# Patient Record
Sex: Male | Born: 1970
Health system: Southern US, Community
[De-identification: ages and names within clinical notes are randomized; demographics above are authoritative.]

## PROBLEM LIST (undated history)

## (undated) DIAGNOSIS — F1010 Alcohol abuse, uncomplicated: Secondary | ICD-10-CM

## (undated) DIAGNOSIS — I10 Essential (primary) hypertension: Secondary | ICD-10-CM

## (undated) HISTORY — DX: Essential (primary) hypertension: I10

## (undated) HISTORY — DX: Alcohol abuse, uncomplicated: F10.10

## (undated) HISTORY — PX: HERNIA REPAIR: SHX51

## (undated) HISTORY — PX: LUNG SURGERY: SHX703

## (undated) HISTORY — PX: OTHER SURGICAL HISTORY: SHX169

---

## 2007-12-09 ENCOUNTER — Emergency Department (HOSPITAL_COMMUNITY): Admission: EM | Admit: 2007-12-09 | Discharge: 2007-12-09 | Payer: Self-pay | Admitting: Emergency Medicine

## 2008-01-15 ENCOUNTER — Emergency Department (HOSPITAL_COMMUNITY): Admission: EM | Admit: 2008-01-15 | Discharge: 2008-01-15 | Payer: Self-pay | Admitting: Emergency Medicine

## 2008-10-08 ENCOUNTER — Emergency Department (HOSPITAL_COMMUNITY): Admission: EM | Admit: 2008-10-08 | Discharge: 2008-10-08 | Payer: Self-pay | Admitting: Emergency Medicine

## 2009-06-20 ENCOUNTER — Emergency Department (HOSPITAL_COMMUNITY): Admission: EM | Admit: 2009-06-20 | Discharge: 2009-06-20 | Payer: Self-pay | Admitting: Emergency Medicine

## 2009-11-01 ENCOUNTER — Emergency Department (HOSPITAL_COMMUNITY): Admission: EM | Admit: 2009-11-01 | Discharge: 2009-11-01 | Payer: Self-pay | Admitting: Emergency Medicine

## 2010-08-15 LAB — GC/CHLAMYDIA PROBE AMP, GENITAL
Chlamydia, DNA Probe: NEGATIVE
GC Probe Amp, Genital: NEGATIVE

## 2010-08-15 LAB — HEMOCCULT GUIAC POC 1CARD (OFFICE): Fecal Occult Bld: POSITIVE

## 2010-08-15 LAB — POCT I-STAT, CHEM 8
Chloride: 108 mEq/L (ref 96–112)
HCT: 50 % (ref 39.0–52.0)
Hemoglobin: 17 g/dL (ref 13.0–17.0)
Potassium: 4.5 mEq/L (ref 3.5–5.1)

## 2010-08-16 LAB — URINALYSIS, ROUTINE W REFLEX MICROSCOPIC
Bilirubin Urine: NEGATIVE
Ketones, ur: NEGATIVE mg/dL
Nitrite: NEGATIVE
Specific Gravity, Urine: 1.026 (ref 1.005–1.030)
Urobilinogen, UA: 1 mg/dL (ref 0.0–1.0)
pH: 5.5 (ref 5.0–8.0)

## 2010-08-16 LAB — URINE CULTURE: Colony Count: 2000

## 2010-08-16 LAB — URINE MICROSCOPIC-ADD ON

## 2010-08-16 LAB — GC/CHLAMYDIA PROBE AMP, GENITAL
Chlamydia, DNA Probe: NEGATIVE
GC Probe Amp, Genital: NEGATIVE

## 2010-10-11 ENCOUNTER — Emergency Department (HOSPITAL_COMMUNITY)
Admission: EM | Admit: 2010-10-11 | Discharge: 2010-10-11 | Disposition: A | Discharge status: Home / Self Care | Payer: Self-pay | Attending: Emergency Medicine | Admitting: Emergency Medicine

## 2010-10-11 DIAGNOSIS — M5380 Other specified dorsopathies, site unspecified: Secondary | ICD-10-CM | POA: Insufficient documentation

## 2010-10-11 DIAGNOSIS — M545 Low back pain, unspecified: Secondary | ICD-10-CM | POA: Insufficient documentation

## 2010-10-11 DIAGNOSIS — M542 Cervicalgia: Secondary | ICD-10-CM | POA: Insufficient documentation

## 2010-10-11 DIAGNOSIS — M546 Pain in thoracic spine: Secondary | ICD-10-CM | POA: Insufficient documentation

## 2011-02-24 LAB — URINALYSIS, ROUTINE W REFLEX MICROSCOPIC
Glucose, UA: NEGATIVE
Hgb urine dipstick: NEGATIVE
pH: 6

## 2011-02-24 LAB — GC/CHLAMYDIA PROBE AMP, GENITAL: GC Probe Amp, Genital: NEGATIVE

## 2012-02-20 ENCOUNTER — Ambulatory Visit (INDEPENDENT_AMBULATORY_CARE_PROVIDER_SITE_OTHER): Payer: PRIVATE HEALTH INSURANCE | Admitting: Family Medicine

## 2012-02-20 ENCOUNTER — Ambulatory Visit: Payer: PRIVATE HEALTH INSURANCE

## 2012-02-20 VITALS — BP 128/92 | HR 66 | Temp 97.8°F | Resp 16 | Ht 72.5 in | Wt 216.0 lb

## 2012-02-20 DIAGNOSIS — M545 Low back pain: Secondary | ICD-10-CM

## 2012-02-20 MED ORDER — CYCLOBENZAPRINE HCL 10 MG PO TABS: 10.0000 mg | ORAL_TABLET | Freq: Two times a day (BID) | ORAL | Status: DC | PRN
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
## 2012-02-21 ENCOUNTER — Telehealth: Payer: Self-pay

## 2012-02-21 NOTE — Telephone Encounter (Signed)
Pt in pain and would like to speak to clinical, saw Dr. Patsy Lager yesterday.    [phone]

## 2012-02-22 NOTE — Telephone Encounter (Signed)
Pt came into the office today to get a note to be out until Monday. Dr Patsy Lager approved the note and I advised pt to come in if he is not better by Monday. Pt understood

## 2012-03-05 ENCOUNTER — Telehealth: Payer: Self-pay

## 2012-03-05 DIAGNOSIS — M549 Dorsalgia, unspecified: Secondary | ICD-10-CM

## 2012-03-05 NOTE — Telephone Encounter (Signed)
PT STATES HE IS STILL HAVING BACK PAIN AND WOULD LIKE TO BE REFERRED PLEASE CALL [phone]

## 2012-03-05 NOTE — Telephone Encounter (Signed)
Called Mr. Liad- we only have one number listed.  I was not able to leave a VM, no answer.  I put in a referral to ortho for him.  Will try him again tomorrow

## 2012-03-05 NOTE — Telephone Encounter (Signed)
I spoke to patient to advise, he has been seen at [facility], for another problem. Wants referral to go there. He is now having right leg pain and back pain has not improved. He was thankful. I left message for referrals so they would know where to send this referral. Thanks Amy

## 2012-03-05 NOTE — Telephone Encounter (Signed)
Dr. Copland, please advise 

## 2013-08-13 ENCOUNTER — Encounter: Payer: Self-pay | Admitting: Physician Assistant

## 2013-08-13 ENCOUNTER — Ambulatory Visit (INDEPENDENT_AMBULATORY_CARE_PROVIDER_SITE_OTHER): Payer: PRIVATE HEALTH INSURANCE | Admitting: Physician Assistant

## 2013-08-13 VITALS — BP 132/80 | HR 78 | Temp 98.1°F | Ht 74.0 in | Wt 224.0 lb

## 2013-08-13 DIAGNOSIS — K5900 Constipation, unspecified: Secondary | ICD-10-CM | POA: Insufficient documentation

## 2013-08-13 DIAGNOSIS — Z0000 Encounter for general adult medical examination without abnormal findings: Secondary | ICD-10-CM

## 2013-08-13 DIAGNOSIS — Z23 Encounter for immunization: Secondary | ICD-10-CM

## 2013-08-13 DIAGNOSIS — K219 Gastro-esophageal reflux disease without esophagitis: Secondary | ICD-10-CM | POA: Insufficient documentation

## 2013-08-13 DIAGNOSIS — F17200 Nicotine dependence, unspecified, uncomplicated: Secondary | ICD-10-CM

## 2013-08-13 MED ORDER — OMEPRAZOLE 20 MG PO CPDR: 20.0000 mg | DELAYED_RELEASE_CAPSULE | Freq: Every day | ORAL | Status: DC
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
## 2013-08-13 NOTE — Assessment & Plan Note (Signed)
Rx for Omeprazole 20 mg once daily Counseled patient to take medication daily and not wait till symptoms present as he has in past.  Counseled patient on prevention of symptoms to include discontinue smoking, cut back on greasy/spicy foods, elevate bed, smaller meals, no eating within two hours of going to bed.

## 2013-08-13 NOTE — Patient Instructions (Addendum)
It was great meeting you today Scott Galloway!  Labs have been ordered for you, when you report to lab please be fasting.     High-Fiber Diet Fiber is found in fruits, vegetables, and grains. A high-fiber diet encourages the addition of more whole grains, legumes, fruits, and vegetables in your diet. The recommended amount of fiber for adult males is 38 g per day. For adult females, it is 25 g per day. Pregnant and lactating women should get 28 g of fiber per day. If you have a digestive or bowel problem, ask your caregiver for advice before adding high-fiber foods to your diet. Eat a variety of high-fiber foods instead of only a select few type of foods.  PURPOSE  To increase stool bulk.  To make bowel movements more regular to prevent constipation.  To lower cholesterol.  To prevent overeating. WHEN IS THIS DIET USED?  It may be used if you have constipation and hemorrhoids.  It may be used if you have uncomplicated diverticulosis (intestine condition) and irritable bowel syndrome.  It may be used if you need help with weight management.  It may be used if you want to add it to your diet as a protective measure against atherosclerosis, diabetes, and cancer. SOURCES OF FIBER  Whole-grain breads and cereals.  Fruits, such as apples, oranges, bananas, berries, prunes, and pears.  Vegetables, such as green peas, carrots, sweet potatoes, beets, broccoli, cabbage, spinach, and artichokes.  Legumes, such split peas, soy, lentils.  Almonds. FIBER CONTENT IN FOODS Starches and Grains / Dietary Fiber (g)  Cheerios, 1 cup / 3 g  Corn Flakes cereal, 1 cup / 0.7 g  Rice crispy treat cereal, 1 cup / 0.3 g  Instant oatmeal (cooked),  cup / 2 g  Frosted wheat cereal, 1 cup / 5.1 g  Brown, long-grain rice (cooked), 1 cup / 3.5 g  White, long-grain rice (cooked), 1 cup / 0.6 g  Enriched macaroni (cooked), 1 cup / 2.5 g Legumes / Dietary Fiber (g)  Baked beans (canned, plain, or  vegetarian),  cup / 5.2 g  Kidney beans (canned),  cup / 6.8 g  Pinto beans (cooked),  cup / 5.5 g Breads and Crackers / Dietary Fiber (g)  Plain or honey graham crackers, 2 squares / 0.7 g  Saltine crackers, 3 squares / 0.3 g  Plain, salted pretzels, 10 pieces / 1.8 g  Whole-wheat bread, 1 slice / 1.9 g  White bread, 1 slice / 0.7 g  Raisin bread, 1 slice / 1.2 g  Plain bagel, 3 oz / 2 g  Flour tortilla, 1 oz / 0.9 g  Corn tortilla, 1 small / 1.5 g  Hamburger or hotdog bun, 1 small / 0.9 g Fruits / Dietary Fiber (g)  Apple with skin, 1 medium / 4.4 g  Sweetened applesauce,  cup / 1.5 g  Banana,  medium / 1.5 g  Grapes, 10 grapes / 0.4 g  Orange, 1 small / 2.3 g  Raisin, 1.5 oz / 1.6 g  Melon, 1 cup / 1.4 g Vegetables / Dietary Fiber (g)  Green beans (canned),  cup / 1.3 g  Carrots (cooked),  cup / 2.3 g  Broccoli (cooked),  cup / 2.8 g  Peas (cooked),  cup / 4.4 g  Mashed potatoes,  cup / 1.6 g  Lettuce, 1 cup / 0.5 g  Corn (canned),  cup / 1.6 g  Tomato,  cup / 1.1 g Document Released: 05/16/2005 Document  Revised: 11/15/2011 Document Reviewed: 08/18/2011 [facility] Patient Information 2014 [place], [place].   Constipation, Adult Constipation is when a person:  Poops (bowel movement) less than 3 times a week.  Has a hard time pooping.  Has poop that is dry, hard, or bigger than normal. HOME CARE   Eat more fiber, such as fruits, vegetables, whole grains like brown rice, and beans.  Eat less fatty foods and sugar. This includes [place] fries, hamburgers, cookies, candy, and soda.  If you are not getting enough fiber from food, take products with added fiber in them (supplements).  Drink enough fluid to keep your pee (urine) clear or pale yellow.  Go to the restroom when you feel like you need to poop. Do not hold it.  Only take medicine as told by your doctor. Do not take medicines that help you poop (laxatives) without talking  to your doctor first.  Exercise on a regular basis, or as told by your doctor. GET HELP RIGHT AWAY IF:   You have bright red blood in your poop (stool).  Your constipation lasts more than 4 days or gets worse.  You have belly (abdomen) or butt (rectal) pain.  You have thin poop (as thin as a pencil).  You lose weight, and it cannot be explained. MAKE SURE YOU:   Understand these instructions.  Will watch your condition.  Will get help right away if you are not doing well or get worse. Document Released: 11/02/2007 Document Revised: 08/08/2011 Document Reviewed: 02/25/2013 [facility] Patient Information 2014 [place], [place]. Diet for Gastroesophageal Reflux Disease, Adult Reflux is when stomach acid flows up into the esophagus. The esophagus becomes irritated and sore (inflammation). When reflux happens often and is severe, it is called gastroesophageal reflux disease (GERD). What you eat can help ease any discomfort caused by GERD. FOODS OR DRINKS TO AVOID OR LIMIT  Coffee and black tea, with or without caffeine.  Bubbly (carbonated) drinks with caffeine or energy drinks.  Strong spices, such as pepper, cayenne pepper, curry, or chili powder.  Peppermint or spearmint.  Chocolate.  High-fat foods, such as meats, fried food, oils, butter, or nuts.  Fruits and vegetables that cause discomfort. This includes citrus fruits and tomatoes.  Alcohol. If a certain food or drink irritates your GERD, avoid eating or drinking it. THINGS THAT MAY HELP GERD INCLUDE:  Eat meals slowly.  Eat 5 to 6 small meals a day, not 3 large meals.  Do not eat food for a certain amount of time if it causes discomfort.  Wait 3 hours after eating before lying down.  Keep the head of your bed raised 6 to 9 inches (15 23 centimeters). Put a foam wedge or blocks under the legs of the bed.  Stay active. Weight loss, if needed, may help ease your discomfort.  Wear loose-fitting clothing.  Do not  smoke or chew tobacco. Document Released: 11/15/2011 Document Reviewed: 11/15/2011 [facility] Patient Information 2014 [place], [place]. Health Maintenance, Males A healthy lifestyle and preventative care can promote health and wellness.  Maintain regular health, dental, and eye exams.  Eat a healthy diet. Foods like vegetables, fruits, whole grains, low-fat dairy products, and lean protein foods contain the nutrients you need and are low in calories. Decrease your intake of foods high in solid fats, added sugars, and salt. Get information about a proper diet from your health care provider, if necessary.  Regular physical exercise is one of the most important things you can do for your health. Most  adults should get at least 150 minutes of moderate-intensity exercise (any activity that increases your heart rate and causes you to sweat) each week. In addition, most adults need muscle-strengthening exercises on 2 or more days a week.   Maintain a healthy weight. The body mass index (BMI) is a screening tool to identify possible weight problems. It provides an estimate of body fat based on height and weight. Your health care provider can find your BMI and can help you achieve or maintain a healthy weight. For males 20 years and older:  A BMI below 18.5 is considered underweight.  A BMI of 18.5 to 24.9 is normal.  A BMI of 25 to 29.9 is considered overweight.  A BMI of 30 and above is considered obese.  Maintain normal blood lipids and cholesterol by exercising and minimizing your intake of saturated fat. Eat a balanced diet with plenty of fruits and vegetables. Blood tests for lipids and cholesterol should begin at age 11 and be repeated every 5 years. If your lipid or cholesterol levels are high, you are over 50, or you are at high risk for heart disease, you may need your cholesterol levels checked more frequently.Ongoing high lipid and cholesterol levels should be treated with medicines, if diet  and exercise are not working.  If you smoke, find out from your health care provider how to quit. If you do not use tobacco, do not start.  Lung cancer screening is recommended for adults aged 67 80 years who are at high risk for developing lung cancer because of a history of smoking. A yearly low-dose CT scan of the lungs is recommended for people who have at least a 30-pack-year history of smoking and are a current smoker or have quit within the past 15 years. A pack year of smoking is smoking an average of 1 pack of cigarettes a day for 1 year (for example, a 30-pack-year history of smoking could mean smoking 1 pack a day for 30 years or 2 packs a day for 15 years). Yearly screening should continue until the smoker has stopped smoking for at least 15 years. Yearly screening should be stopped for people who develop a health problem that would prevent them from having lung cancer treatment.  If you choose to drink alcohol, do not have more than 2 drinks per day. One drink is considered to be 12 oz (360 mL) of beer, 5 oz (150 mL) of wine, or 1.5 oz (45 mL) of liquor.  Avoid use of street drugs. Do not share needles with anyone. Ask for help if you need support or instructions about stopping the use of drugs.  High blood pressure causes heart disease and increases the risk of stroke. Blood pressure should be checked at least every 1 2 years. Ongoing high blood pressure should be treated with medicines if weight loss and exercise are not effective.  If you are 61 44 years old, ask your health care provider if you should take aspirin to prevent heart disease.  Diabetes screening involves taking a blood sample to check your fasting blood sugar level. This should be done once every 3 years after age 53, if you are at a normal weight and without risk factors for diabetes. Testing should be considered at a younger age or be carried out more frequently if you are overweight and have at least 1 risk factor for  diabetes.  Colorectal cancer can be detected and often prevented. Most routine colorectal cancer screening begins at  the age of 73 and continues through age 71. However, your health care provider may recommend screening at an earlier age if you have risk factors for colon cancer. On a yearly basis, your health care provider may provide home test kits to check for hidden blood in the stool. A small camera at the end of a tube may be used to directly examine the colon (sigmoidoscopy or colonoscopy) to detect the earliest forms of colorectal cancer. Talk to your health care provider about this at age 6, when routine screening begins. A direct exam of the colon should be repeated every 5 10 years through age 75, unless early forms of pre-cancerous polyps or small growths are found.  People who are at an increased risk for hepatitis B should be screened for this virus. You are considered at high risk for hepatitis B if:  You were born in a country where hepatitis B occurs often. Talk with your health care provider about which countries are considered high-risk.  Your parents were born in a high-risk country and you have not received a shot to protect against hepatitis B (hepatitis B vaccine).  You have HIV or AIDS.  You use needles to inject street drugs.  You live with, or have sex with, someone who has hepatitis B.  You are a man who has sex with other men (MSM).  You get hemodialysis treatment.  You take certain medicines for conditions like cancer, organ transplantation, and autoimmune conditions.  Hepatitis C blood testing is recommended for all people born from [birth year] through 1966 and any individual with known risk factors for hepatitis C.  Healthy men should no longer receive prostate-specific antigen (PSA) blood tests as part of routine cancer screening. Talk to your health care provider about prostate cancer screening.  Testicular cancer screening is not recommended for adolescents or  adult males who have no symptoms. Screening includes self-exam, a health care provider exam, and other screening tests. Consult with your health care provider about any symptoms you have or any concerns you have about testicular cancer.  Practice safe sex. Use condoms and avoid high-risk sexual practices to reduce the spread of sexually transmitted infections (STIs).  Use sunscreen. Apply sunscreen liberally and repeatedly throughout the day. You should seek shade when your shadow is shorter than you. Protect yourself by wearing long sleeves, pants, a wide-brimmed hat, and sunglasses year round, whenever you are outdoors.  Tell your health care provider of new moles or changes in moles, especially if there is a change in shape or color. Also tell your provider if a mole is larger than the size of a pencil eraser.  A one-time screening for abdominal aortic aneurysm (AAA) and surgical repair of large AAAs by ultrasound is recommended for men aged 47 75 years who are current or former smokers.  Stay current with your vaccines (immunizations). Document Released: 11/12/2007 Document Revised: 03/06/2013 Document Reviewed: 10/11/2010 [facility] Patient Information 2014 [place], [place].

## 2013-08-13 NOTE — Progress Notes (Signed)
Pre visit review using our clinic review tool, if applicable. No additional management support is needed unless otherwise documented below in the visit note. 

## 2013-08-13 NOTE — Assessment & Plan Note (Signed)
Patient not interested in quitting at this time 

## 2013-08-13 NOTE — Assessment & Plan Note (Signed)
Intermittent, counseled patient on good hydration, high fiber diet

## 2013-08-16 ENCOUNTER — Other Ambulatory Visit (INDEPENDENT_AMBULATORY_CARE_PROVIDER_SITE_OTHER): Payer: PRIVATE HEALTH INSURANCE

## 2013-08-16 DIAGNOSIS — K219 Gastro-esophageal reflux disease without esophagitis: Secondary | ICD-10-CM

## 2013-08-16 DIAGNOSIS — K5900 Constipation, unspecified: Secondary | ICD-10-CM

## 2013-08-16 DIAGNOSIS — F17200 Nicotine dependence, unspecified, uncomplicated: Secondary | ICD-10-CM

## 2013-08-16 DIAGNOSIS — Z0000 Encounter for general adult medical examination without abnormal findings: Secondary | ICD-10-CM

## 2013-08-16 LAB — URINALYSIS, ROUTINE W REFLEX MICROSCOPIC
Bilirubin Urine: NEGATIVE
HGB URINE DIPSTICK: NEGATIVE
KETONES UR: NEGATIVE
LEUKOCYTES UA: NEGATIVE
Nitrite: NEGATIVE
SPECIFIC GRAVITY, URINE: 1.02 (ref 1.000–1.030)
Total Protein, Urine: NEGATIVE
UROBILINOGEN UA: 0.2 (ref 0.0–1.0)
Urine Glucose: NEGATIVE
pH: 6 (ref 5.0–8.0)

## 2013-08-16 LAB — CBC WITH DIFFERENTIAL/PLATELET
BASOS ABS: 0 10*3/uL (ref 0.0–0.1)
Basophils Relative: 0.5 % (ref 0.0–3.0)
EOS ABS: 0.1 10*3/uL (ref 0.0–0.7)
Eosinophils Relative: 1.5 % (ref 0.0–5.0)
HCT: 44.9 % (ref 39.0–52.0)
Hemoglobin: 15 g/dL (ref 13.0–17.0)
LYMPHS PCT: 27.9 % (ref 12.0–46.0)
Lymphs Abs: 2.4 10*3/uL (ref 0.7–4.0)
MCHC: 33.3 g/dL (ref 30.0–36.0)
MCV: 89.9 fl (ref 78.0–100.0)
Monocytes Absolute: 0.7 10*3/uL (ref 0.1–1.0)
Monocytes Relative: 8.2 % (ref 3.0–12.0)
NEUTROS PCT: 61.9 % (ref 43.0–77.0)
Neutro Abs: 5.3 10*3/uL (ref 1.4–7.7)
Platelets: 325 10*3/uL (ref 150.0–400.0)
RBC: 5 Mil/uL (ref 4.22–5.81)
RDW: 14.1 % (ref 11.5–14.6)
WBC: 8.6 10*3/uL (ref 4.5–10.5)

## 2013-08-16 LAB — LIPID PANEL
CHOL/HDL RATIO: 4
Cholesterol: 195 mg/dL (ref 0–200)
HDL: 44 mg/dL (ref >39.00)
LDL Cholesterol: 140 mg/dL — ABNORMAL HIGH (ref 0–99)
Triglycerides: 56 mg/dL (ref 0.0–149.0)
VLDL: 11.2 mg/dL (ref 0.0–40.0)

## 2013-08-16 LAB — HEPATIC FUNCTION PANEL
ALT: 16 U/L (ref 0–53)
AST: 19 U/L (ref 0–37)
Albumin: 4.2 g/dL (ref 3.5–5.2)
Alkaline Phosphatase: 65 U/L (ref 39–117)
BILIRUBIN TOTAL: 0.9 mg/dL (ref 0.3–1.2)
Bilirubin, Direct: 0.1 mg/dL (ref 0.0–0.3)
Total Protein: 7.7 g/dL (ref 6.0–8.3)

## 2013-08-16 LAB — BASIC METABOLIC PANEL
BUN: 10 mg/dL (ref 6–23)
CALCIUM: 9.4 mg/dL (ref 8.4–10.5)
CHLORIDE: 106 meq/L (ref 96–112)
CO2: 27 meq/L (ref 19–32)
Creatinine, Ser: 1.2 mg/dL (ref 0.4–1.5)
GFR: 86.43 mL/min (ref >60.00)
GLUCOSE: 79 mg/dL (ref 70–99)
Potassium: 4 mEq/L (ref 3.5–5.1)
SODIUM: 140 meq/L (ref 135–145)

## 2013-08-16 LAB — TSH: TSH: 1.13 u[IU]/mL (ref 0.35–5.50)

## 2013-09-05 ENCOUNTER — Ambulatory Visit (INDEPENDENT_AMBULATORY_CARE_PROVIDER_SITE_OTHER): Payer: PRIVATE HEALTH INSURANCE | Admitting: Internal Medicine

## 2013-09-05 ENCOUNTER — Encounter: Payer: Self-pay | Admitting: Internal Medicine

## 2013-09-05 VITALS — BP 122/82 | HR 98 | Temp 97.7°F | Resp 16 | Wt 222.0 lb

## 2013-09-05 DIAGNOSIS — M545 Low back pain, unspecified: Secondary | ICD-10-CM | POA: Insufficient documentation

## 2013-09-05 MED ORDER — IBUPROFEN 800 MG PO TABS: 800.0000 mg | ORAL_TABLET | Freq: Three times a day (TID) | ORAL | Status: DC | PRN

## 2013-09-05 NOTE — Patient Instructions (Signed)
Back Pain, Adult Low back pain is very common. About 1 in 5 people have back pain.The cause of low back pain is rarely dangerous. The pain often gets better over time.About half of people with a sudden onset of back pain feel better in just 2 weeks. About 8 in 10 people feel better by 6 weeks.  CAUSES Some common causes of back pain include:  Strain of the muscles or ligaments supporting the spine.  Wear and tear (degeneration) of the spinal discs.  Arthritis.  Direct injury to the back. DIAGNOSIS Most of the time, the direct cause of low back pain is not known.However, back pain can be treated effectively even when the exact cause of the pain is unknown.Answering your caregiver's questions about your overall health and symptoms is one of the most accurate ways to make sure the cause of your pain is not dangerous. If your caregiver needs more information, he or she may order lab work or imaging tests (X-rays or MRIs).However, even if imaging tests show changes in your back, this usually does not require surgery. HOME CARE INSTRUCTIONS For many people, back pain returns.Since low back pain is rarely dangerous, it is often a condition that people can learn to manageon their own.   Remain active. It is stressful on the back to sit or stand in one place. Do not sit, drive, or stand in one place for more than 30 minutes at a time. Take short walks on level surfaces as soon as pain allows.Try to increase the length of time you walk each day.  Do not stay in bed.Resting more than 1 or 2 days can delay your recovery.  Do not avoid exercise or work.Your body is made to move.It is not dangerous to be active, even though your back may hurt.Your back will likely heal faster if you return to being active before your pain is gone.  Pay attention to your body when you bend and lift. Many people have less discomfortwhen lifting if they bend their knees, keep the load close to their bodies,and  avoid twisting. Often, the most comfortable positions are those that put less stress on your recovering back.  Find a comfortable position to sleep. Use a firm mattress and lie on your side with your knees slightly bent. If you lie on your back, put a pillow under your knees.  Only take over-the-counter or prescription medicines as directed by your caregiver. Over-the-counter medicines to reduce pain and inflammation are often the most helpful.Your caregiver may prescribe muscle relaxant drugs.These medicines help dull your pain so you can more quickly return to your normal activities and healthy exercise.  Put ice on the injured area.  Put ice in a plastic bag.  Place a towel between your skin and the bag.  Leave the ice on for 15-20 minutes, 03-04 times a day for the first 2 to 3 days. After that, ice and heat may be alternated to reduce pain and spasms.  Ask your caregiver about trying back exercises and gentle massage. This may be of some benefit.  Avoid feeling anxious or stressed.Stress increases muscle tension and can worsen back pain.It is important to recognize when you are anxious or stressed and learn ways to manage it.Exercise is a great option. SEEK MEDICAL CARE IF:  You have pain that is not relieved with rest or medicine.  You have pain that does not improve in 1 week.  You have new symptoms.  You are generally not feeling well. SEEK   IMMEDIATE MEDICAL CARE IF:   You have pain that radiates from your back into your legs.  You develop new bowel or bladder control problems.  You have unusual weakness or numbness in your arms or legs.  You develop nausea or vomiting.  You develop abdominal pain.  You feel faint. Document Released: 05/16/2005 Document Revised: 11/15/2011 Document Reviewed: 10/04/2010 [employer] Patient Information 2014 [employer].  

## 2013-09-05 NOTE — Assessment & Plan Note (Signed)
There are no alarm s/s and no evidence of radiculopathy Will start PT and high dose nsaids Out of work for 5 days days

## 2013-09-05 NOTE — Progress Notes (Signed)
Subjective:    Patient ID: Scott Galloway, male    DOB: 1970/09/03, 43 y.o.   MRN: [id number]  Back Pain This is a recurrent (this episode of pain started about one week ago) problem. The current episode started more than 1 year ago. The problem occurs intermittently. The problem is unchanged. The pain is present in the lumbar spine. The quality of the pain is described as stabbing. The pain does not radiate. The pain is at a severity of 2/10. The pain is mild. The pain is the same all the time. The symptoms are aggravated by bending and position. Pertinent negatives include no abdominal pain, bladder incontinence, bowel incontinence, chest pain, dysuria, fever, headaches, leg pain, numbness, paresis, paresthesias, pelvic pain, perianal numbness, tingling, weakness or weight loss. He has tried NSAIDs for the symptoms. The treatment provided mild relief.      Review of Systems  Constitutional: Negative for fever and weight loss.  Cardiovascular: Negative for chest pain.  Gastrointestinal: Negative for abdominal pain and bowel incontinence.  Genitourinary: Negative for bladder incontinence, dysuria and pelvic pain.  Musculoskeletal: Positive for back pain.  Neurological: Negative for tingling, weakness, numbness, headaches and paresthesias.  All other systems reviewed and are negative.      Objective:   Physical Exam  Constitutional: He is oriented to person, place, and time. He appears well-developed.  HENT:  Head: Normocephalic and atraumatic.  Mouth/Throat: Oropharynx is clear and moist. No oropharyngeal exudate.  Eyes: Conjunctivae are normal. Right eye exhibits no discharge. Left eye exhibits no discharge. No scleral icterus.  Neck: Normal range of motion. Neck supple. No JVD present. No tracheal deviation present. No thyromegaly present.  Cardiovascular: Normal rate, regular rhythm, normal heart sounds and intact distal pulses.  Exam reveals no gallop and no friction rub.   No  murmur heard. Pulmonary/Chest: Effort normal and breath sounds normal. No stridor. No respiratory distress. He has no wheezes. He has no rales. He exhibits no tenderness.  Abdominal: Soft. Bowel sounds are normal. He exhibits no distension and no mass. There is no tenderness. There is no rebound and no guarding.  Musculoskeletal: Normal range of motion. He exhibits no edema and no tenderness.       Lumbar back: Normal. He exhibits normal range of motion, no tenderness, no bony tenderness, no swelling, no edema, no deformity, no laceration, no pain, no spasm and normal pulse.  Lymphadenopathy:    He has no cervical adenopathy.  Neurological: He is alert and oriented to person, place, and time. He displays no atrophy and normal reflexes. No cranial nerve deficit or sensory deficit. He exhibits normal muscle tone. He displays a negative Romberg sign. He displays no seizure activity. Coordination and gait normal.  Reflex Scores:      Tricep reflexes are 0 on the right side and 0 on the left side.      Bicep reflexes are 0 on the right side and 0 on the left side.      Brachioradialis reflexes are 0 on the right side and 0 on the left side.      Patellar reflexes are 0 on the right side and 0 on the left side.      Achilles reflexes are 0 on the right side and 0 on the left side. Neg SLE in BLE  Skin: Skin is warm and dry. No rash noted. He is not diaphoretic. No erythema. No pallor.  Psychiatric: He has a normal mood and affect. His behavior is  normal. Judgment and thought content normal.          Assessment & Plan:

## 2013-09-05 NOTE — Progress Notes (Signed)
Pre visit review using our clinic review tool, if applicable. No additional management support is needed unless otherwise documented below in the visit note. 

## 2014-05-10 ENCOUNTER — Emergency Department (HOSPITAL_COMMUNITY)
Admission: EM | Admit: 2014-05-10 | Discharge: 2014-05-10 | Disposition: A | Discharge status: Home / Self Care | Payer: PRIVATE HEALTH INSURANCE | Attending: Emergency Medicine | Admitting: Emergency Medicine

## 2014-05-10 ENCOUNTER — Encounter (HOSPITAL_COMMUNITY): Payer: Self-pay | Admitting: *Deleted

## 2014-05-10 DIAGNOSIS — G8929 Other chronic pain: Secondary | ICD-10-CM | POA: Diagnosis not present

## 2014-05-10 DIAGNOSIS — Z79899 Other long term (current) drug therapy: Secondary | ICD-10-CM | POA: Diagnosis not present

## 2014-05-10 DIAGNOSIS — M5432 Sciatica, left side: Secondary | ICD-10-CM | POA: Insufficient documentation

## 2014-05-10 DIAGNOSIS — Z720 Tobacco use: Secondary | ICD-10-CM | POA: Diagnosis not present

## 2014-05-10 DIAGNOSIS — M545 Low back pain: Secondary | ICD-10-CM | POA: Diagnosis present

## 2014-05-10 MED ORDER — HYDROCODONE-ACETAMINOPHEN 5-325 MG PO TABS: 1.0000 | ORAL_TABLET | ORAL | Status: DC | PRN

## 2014-05-10 MED ORDER — CYCLOBENZAPRINE HCL 10 MG PO TABS: 10.0000 mg | ORAL_TABLET | Freq: Two times a day (BID) | ORAL | Status: DC | PRN
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
## 2014-05-10 NOTE — Discharge Instructions (Signed)
Sciatica Sciatica is pain, weakness, numbness, or tingling along the path of the sciatic nerve. The nerve starts in the lower back and runs down the back of each leg. The nerve controls the muscles in the lower leg and in the back of the knee, while also providing sensation to the back of the thigh, lower leg, and the sole of your foot. Sciatica is a symptom of another medical condition. For instance, nerve damage or certain conditions, such as a herniated disk or bone spur on the spine, pinch or put pressure on the sciatic nerve. This causes the pain, weakness, or other sensations normally associated with sciatica. Generally, sciatica only affects one side of the body. CAUSES   Herniated or slipped disc.  Degenerative disk disease.  A pain disorder involving the narrow muscle in the buttocks (piriformis syndrome).  Pelvic injury or fracture.  Pregnancy.  Tumor (rare). SYMPTOMS  Symptoms can vary from mild to very severe. The symptoms usually travel from the low back to the buttocks and down the back of the leg. Symptoms can include:  Mild tingling or dull aches in the lower back, leg, or hip.  Numbness in the back of the calf or sole of the foot.  Burning sensations in the lower back, leg, or hip.  Sharp pains in the lower back, leg, or hip.  Leg weakness.  Severe back pain inhibiting movement. These symptoms may get worse with coughing, sneezing, laughing, or prolonged sitting or standing. Also, being overweight may worsen symptoms. DIAGNOSIS  Your caregiver will perform a physical exam to look for common symptoms of sciatica. He or she may ask you to do certain movements or activities that would trigger sciatic nerve pain. Other tests may be performed to find the cause of the sciatica. These may include:  Blood tests.  X-rays.  Imaging tests, such as an MRI or CT scan. TREATMENT  Treatment is directed at the cause of the sciatic pain. Sometimes, treatment is not necessary  and the pain and discomfort goes away on its own. If treatment is needed, your caregiver may suggest:  Over-the-counter medicines to relieve pain.  Prescription medicines, such as anti-inflammatory medicine, muscle relaxants, or narcotics.  Applying heat or ice to the painful area.  Steroid injections to lessen pain, irritation, and inflammation around the nerve.  Reducing activity during periods of pain.  Exercising and stretching to strengthen your abdomen and improve flexibility of your spine. Your caregiver may suggest losing weight if the extra weight makes the back pain worse.  Physical therapy.  Surgery to eliminate what is pressing or pinching the nerve, such as a bone spur or part of a herniated disk. HOME CARE INSTRUCTIONS   Only take over-the-counter or prescription medicines for pain or discomfort as directed by your caregiver.  Apply ice to the affected area for 20 minutes, 3-4 times a day for the first 48-72 hours. Then try heat in the same way.  Exercise, stretch, or perform your usual activities if these do not aggravate your pain.  Attend physical therapy sessions as directed by your caregiver.  Keep all follow-up appointments as directed by your caregiver.  Do not wear high heels or shoes that do not provide proper support.  Check your mattress to see if it is too soft. A firm mattress may lessen your pain and discomfort. SEEK IMMEDIATE MEDICAL CARE IF:   You lose control of your bowel or bladder (incontinence).  You have increasing weakness in the lower back, pelvis, buttocks,   or legs.  You have redness or swelling of your back.  You have a burning sensation when you urinate.  You have pain that gets worse when you lie down or awakens you at night.  Your pain is worse than you have experienced in the past.  Your pain is lasting longer than 4 weeks.  You are suddenly losing weight without reason. MAKE SURE YOU:  Understand these  instructions.  Will watch your condition.  Will get help right away if you are not doing well or get worse. Document Released: 05/10/2001 Document Revised: 11/15/2011 Document Reviewed: 09/25/2011 [employer] Patient Information 2015 [employer]. This information is not intended to replace advice given to you by your health care provider. Make sure you discuss any questions you have with your health care provider.  

## 2014-05-10 NOTE — ED Notes (Signed)
Pt c/o lower back pain. Usually 4/10, but woke up this am, 8/10 and extending down into R upper leg, throbbing pain. Denies known acute injury.

## 2014-05-13 ENCOUNTER — Ambulatory Visit (INDEPENDENT_AMBULATORY_CARE_PROVIDER_SITE_OTHER): Payer: PRIVATE HEALTH INSURANCE | Admitting: Internal Medicine

## 2014-05-13 ENCOUNTER — Ambulatory Visit (INDEPENDENT_AMBULATORY_CARE_PROVIDER_SITE_OTHER): Payer: PRIVATE HEALTH INSURANCE

## 2014-05-13 VITALS — BP 126/88 | HR 90 | Temp 97.7°F | Resp 18 | Ht 74.0 in | Wt 220.2 lb

## 2014-05-13 DIAGNOSIS — M545 Low back pain, unspecified: Secondary | ICD-10-CM

## 2014-05-13 DIAGNOSIS — M79605 Pain in left leg: Secondary | ICD-10-CM

## 2014-05-13 MED ORDER — GABAPENTIN 300 MG PO CAPS: ORAL_CAPSULE | ORAL | Status: DC

## 2014-05-13 MED ORDER — OXYCODONE-ACETAMINOPHEN 10-325 MG PO TABS: 1.0000 | ORAL_TABLET | Freq: Four times a day (QID) | ORAL | Status: DC | PRN

## 2014-05-13 MED ORDER — PREDNISONE 20 MG PO TABS: ORAL_TABLET | ORAL | Status: DC
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
## 2014-05-14 ENCOUNTER — Encounter: Payer: Self-pay | Admitting: Family Medicine

## 2014-05-14 ENCOUNTER — Encounter: Payer: Self-pay | Admitting: Radiology

## 2014-05-16 ENCOUNTER — Telehealth: Payer: Self-pay

## 2014-05-16 ENCOUNTER — Other Ambulatory Visit: Payer: Self-pay | Admitting: Internal Medicine

## 2014-05-16 DIAGNOSIS — M79605 Pain in left leg: Principal | ICD-10-CM

## 2014-05-16 DIAGNOSIS — M545 Low back pain: Secondary | ICD-10-CM

## 2014-05-16 NOTE — Telephone Encounter (Signed)
Patient is calling regarding a referral for neurosurgery. There is a referral that was put in for radiology on 05/14/14, however, referrals does not have a referral for neurosurgery.

## 2014-05-16 NOTE — Telephone Encounter (Signed)
Spoke to pt, spoke to pt, he was referred for an MRI. He was called and appointment has been scheduled for 05/26/2014.  However he is going to reschedule this appointment to a time after 05/30/2014 to avoid paying a deductible for the end of 2015 and another for 2016.   i advised him to call and reschedule his appointment, he understood and will call himself

## 2014-05-19 ENCOUNTER — Other Ambulatory Visit: Payer: Self-pay | Admitting: Internal Medicine

## 2014-05-19 DIAGNOSIS — Z139 Encounter for screening, unspecified: Secondary | ICD-10-CM

## 2014-05-20 ENCOUNTER — Telehealth: Payer: Self-pay

## 2014-05-20 ENCOUNTER — Ambulatory Visit (INDEPENDENT_AMBULATORY_CARE_PROVIDER_SITE_OTHER): Payer: PRIVATE HEALTH INSURANCE | Admitting: Family Medicine

## 2014-05-20 VITALS — BP 140/92 | HR 86 | Temp 97.8°F | Resp 18 | Ht 73.0 in | Wt 220.6 lb

## 2014-05-20 DIAGNOSIS — M545 Low back pain: Secondary | ICD-10-CM

## 2014-05-20 DIAGNOSIS — M25562 Pain in left knee: Secondary | ICD-10-CM

## 2014-05-20 MED ORDER — OXYCODONE-ACETAMINOPHEN 10-325 MG PO TABS: 1.0000 | ORAL_TABLET | Freq: Four times a day (QID) | ORAL | Status: DC | PRN

## 2014-05-20 MED ORDER — KETOROLAC TROMETHAMINE 60 MG/2ML IM SOLN
60.0000 mg | Freq: Once | INTRAMUSCULAR | Status: AC
  Administered 2014-05-20: 60 mg via INTRAMUSCULAR

## 2014-05-20 MED ORDER — MELOXICAM 15 MG PO TABS: 15.0000 mg | ORAL_TABLET | Freq: Every day | ORAL | Status: DC
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
## 2014-05-20 NOTE — Telephone Encounter (Signed)
Patient is in a lot of pain, so much he is having trouble breathing and talking.  He has an appointment on Jan 5th.  Can he have something to see him through   [phone]

## 2014-05-20 NOTE — Telephone Encounter (Signed)
Pt LM on VM advising that he is in a lot of nerve pain down his leg and he has been referred to specialist. He stated he won't get to see the specialist for a while and wondered if there is any type of shot that we could give him that might help the pain. Please advise.

## 2014-05-21 NOTE — Telephone Encounter (Signed)
Spoke to pt advised to RTC today.

## 2014-05-26 ENCOUNTER — Other Ambulatory Visit: Payer: PRIVATE HEALTH INSURANCE

## 2014-06-03 ENCOUNTER — Other Ambulatory Visit: Payer: PRIVATE HEALTH INSURANCE

## 2014-06-03 ENCOUNTER — Inpatient Hospital Stay: Admission: RE | Admit: 2014-06-03 | Payer: PRIVATE HEALTH INSURANCE | Source: Ambulatory Visit

## 2014-06-03 ENCOUNTER — Ambulatory Visit (INDEPENDENT_AMBULATORY_CARE_PROVIDER_SITE_OTHER): Payer: PRIVATE HEALTH INSURANCE | Admitting: Internal Medicine

## 2014-06-03 VITALS — BP 138/96 | HR 86 | Temp 97.4°F | Resp 16 | Ht 73.0 in | Wt 220.2 lb

## 2014-06-03 DIAGNOSIS — M545 Low back pain: Secondary | ICD-10-CM

## 2014-06-03 MED ORDER — GABAPENTIN 300 MG PO CAPS: ORAL_CAPSULE | ORAL | Status: DC

## 2014-06-03 MED ORDER — OXYCODONE-ACETAMINOPHEN 10-325 MG PO TABS: 1.0000 | ORAL_TABLET | Freq: Four times a day (QID) | ORAL | Status: DC | PRN

## 2014-06-03 NOTE — Progress Notes (Signed)
° °  Subjective:    Patient ID: Scott Galloway, male    DOB: 1970/08/09, 44 y.o.   MRN: [id number] This chart was scribed for Ellamae Siaobert Doolittle, MD by Littie Deedsichard Sun, Medical Scribe. This patient was seen in Room 9 and the patient's care was started at 7:20 PM.   HPI HPI Comments: Scott Galloway is a 44 y.o. male who presents to the [facility] complaining of constant, severe back pain radiating to his leg. His MRI was postponed to this Thursday, 2 days from now. He will also see a neurosurgeon in 6 days. Patient has had some difficulty sleeping due to the pain. The steroids he was put on previously did not provide relief to his pain. The pain is worsened with movement. Made worse by his employment but he cannot afford to be off work.   Review of Systems Noncontributory    Objective:   Physical Exam CONSTITUTIONAL: Well developed/well nourished HEAD: Normocephalic/atraumatic exam is as before with significant left lumbar radicular symptoms but no motor sensory losses       Assessment & Plan:  Pain secondary to spine degenerative disease versus foreign body impingement and neurosurgery appointment  Meds ordered this encounter  Medications   gabapentin (NEURONTIN) 300 MG capsule    Sig: 3 at bedtime and 1 in am    Dispense:  120 capsule    Refill:  0   oxyCODONE-acetaminophen (PERCOCET) 10-325 MG per tablet    Sig: Take 1 tablet by mouth every 6 (six) hours as needed for pain. Take with stool softener. Make take 2 at bedtime.    Dispense:  35 tablet    Refill:  0    I have completed the patient encounter in its entirety as documented by the scribe, with editing by me where necessary. Scott Galloway, M.D.

## 2014-06-05 ENCOUNTER — Other Ambulatory Visit: Payer: Self-pay | Admitting: Internal Medicine

## 2014-06-05 ENCOUNTER — Telehealth: Payer: Self-pay

## 2014-06-05 ENCOUNTER — Other Ambulatory Visit: Payer: PRIVATE HEALTH INSURANCE

## 2014-06-05 ENCOUNTER — Inpatient Hospital Stay: Admission: RE | Admit: 2014-06-05 | Payer: PRIVATE HEALTH INSURANCE | Source: Ambulatory Visit

## 2014-06-05 ENCOUNTER — Ambulatory Visit
Admission: RE | Admit: 2014-06-05 | Discharge: 2014-06-05 | Disposition: A | Discharge status: Home / Self Care | Payer: PRIVATE HEALTH INSURANCE | Source: Ambulatory Visit | Attending: Internal Medicine | Admitting: Internal Medicine

## 2014-06-05 DIAGNOSIS — M79605 Pain in left leg: Principal | ICD-10-CM

## 2014-06-05 DIAGNOSIS — M545 Low back pain, unspecified: Secondary | ICD-10-CM

## 2014-06-05 DIAGNOSIS — Z139 Encounter for screening, unspecified: Secondary | ICD-10-CM

## 2014-06-05 NOTE — Telephone Encounter (Signed)
A user error has taken place: encounter opened in error, closed for administrative reasons.

## 2014-06-06 ENCOUNTER — Ambulatory Visit
Admission: RE | Admit: 2014-06-06 | Discharge: 2014-06-06 | Disposition: A | Discharge status: Home / Self Care | Payer: PRIVATE HEALTH INSURANCE | Source: Ambulatory Visit | Attending: Internal Medicine | Admitting: Internal Medicine

## 2014-06-06 ENCOUNTER — Ambulatory Visit: Admission: RE | Admit: 2014-06-06 | Payer: PRIVATE HEALTH INSURANCE | Source: Ambulatory Visit

## 2014-06-06 ENCOUNTER — Other Ambulatory Visit: Payer: Self-pay | Admitting: Internal Medicine

## 2014-06-06 DIAGNOSIS — M79605 Pain in left leg: Principal | ICD-10-CM

## 2014-06-06 DIAGNOSIS — M545 Low back pain, unspecified: Secondary | ICD-10-CM

## 2014-10-17 ENCOUNTER — Ambulatory Visit (INDEPENDENT_AMBULATORY_CARE_PROVIDER_SITE_OTHER): Payer: PRIVATE HEALTH INSURANCE | Admitting: Family Medicine

## 2014-10-17 VITALS — BP 130/84 | HR 73 | Temp 97.9°F | Resp 20 | Ht 74.0 in | Wt 219.0 lb

## 2014-10-17 DIAGNOSIS — K219 Gastro-esophageal reflux disease without esophagitis: Secondary | ICD-10-CM

## 2014-10-17 DIAGNOSIS — M436 Torticollis: Secondary | ICD-10-CM

## 2014-10-17 DIAGNOSIS — M545 Low back pain, unspecified: Secondary | ICD-10-CM

## 2014-10-17 MED ORDER — CYCLOBENZAPRINE HCL 10 MG PO TABS: 10.0000 mg | ORAL_TABLET | Freq: Two times a day (BID) | ORAL | Status: DC | PRN

## 2014-10-17 MED ORDER — KETOROLAC TROMETHAMINE 60 MG/2ML IM SOLN
60.0000 mg | Freq: Once | INTRAMUSCULAR | Status: AC
  Administered 2014-10-17: 60 mg via INTRAMUSCULAR

## 2014-10-17 MED ORDER — OMEPRAZOLE 20 MG PO CPDR: 20.0000 mg | DELAYED_RELEASE_CAPSULE | Freq: Every day | ORAL | Status: DC

## 2014-10-17 MED ORDER — OXYCODONE-ACETAMINOPHEN 10-325 MG PO TABS: 1.0000 | ORAL_TABLET | Freq: Two times a day (BID) | ORAL | Status: DC | PRN

## 2014-10-17 NOTE — Patient Instructions (Signed)
Follow up with your neurosurgeon and pain specialist   Torticollis, Acute You have suddenly (acutely) developed a twisted neck (torticollis). This is usually a self-limited condition. CAUSES  Acute torticollis may be caused by malposition, trauma or infection. Most commonly, acute torticollis is caused by sleeping in an awkward position. Torticollis may also be caused by the flexion, extension or twisting of the neck muscles beyond their normal position. Sometimes, the exact cause may not be known. SYMPTOMS  Usually, there is pain and limited movement of the neck. Your neck may twist to one side. DIAGNOSIS  The diagnosis is often made by physical examination. X-rays, CT scans or MRIs may be done if there is a history of trauma or concern of infection. TREATMENT  For a common, stiff neck that develops during sleep, treatment is focused on relaxing the contracted neck muscle. Medications (including shots) may be used to treat the problem. Most cases resolve in several days. Torticollis usually responds to conservative physical therapy. If left untreated, the shortened and spastic neck muscle can cause deformities in the face and neck. Rarely, surgery is required. HOME CARE INSTRUCTIONS   Use over-the-counter and prescription medications as directed by your caregiver.  Do stretching exercises and massage the neck as directed by your caregiver.  Follow up with physical therapy if needed and as directed by your caregiver. SEEK IMMEDIATE MEDICAL CARE IF:   You develop difficulty breathing or noisy breathing (stridor).  You drool, develop trouble swallowing or have pain with swallowing.  You develop numbness or weakness in the hands or feet.  You have changes in speech or vision.  You have problems with urination or bowel movements.  You have difficulty walking.  You have a fever.  You have increased pain. MAKE SURE YOU:   Understand these instructions.  Will watch your  condition.  Will get help right away if you are not doing well or get worse. Document Released: 05/13/2000 Document Revised: 08/08/2011 Document Reviewed: 06/24/2009 [facility] Patient Information 2015 [employer] [place]. This information is not intended to replace advice given to you by your health care provider. Make sure you discuss any questions you have with your health care provider.
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
## 2014-10-21 ENCOUNTER — Ambulatory Visit (INDEPENDENT_AMBULATORY_CARE_PROVIDER_SITE_OTHER): Payer: PRIVATE HEALTH INSURANCE

## 2014-10-21 ENCOUNTER — Encounter: Payer: Self-pay | Admitting: *Deleted

## 2014-10-21 ENCOUNTER — Ambulatory Visit (INDEPENDENT_AMBULATORY_CARE_PROVIDER_SITE_OTHER): Payer: PRIVATE HEALTH INSURANCE | Admitting: Family Medicine

## 2014-10-21 VITALS — BP 126/98 | HR 73 | Temp 98.4°F | Resp 18 | Wt 216.0 lb

## 2014-10-21 DIAGNOSIS — M542 Cervicalgia: Secondary | ICD-10-CM

## 2014-10-21 DIAGNOSIS — S46011A Strain of muscle(s) and tendon(s) of the rotator cuff of right shoulder, initial encounter: Secondary | ICD-10-CM

## 2014-10-21 DIAGNOSIS — M25511 Pain in right shoulder: Secondary | ICD-10-CM

## 2014-10-21 MED ORDER — METHOCARBAMOL 500 MG PO TABS
500.0000 mg | ORAL_TABLET | Freq: Four times a day (QID) | ORAL | Status: DC | PRN
Start: 2014-10-21 — End: 2015-01-09

## 2014-10-21 MED ORDER — METHYLPREDNISOLONE ACETATE 80 MG/ML IJ SUSP
80.0000 mg | Freq: Once | INTRAMUSCULAR | Status: AC
  Administered 2014-10-21: 80 mg via INTRAMUSCULAR

## 2014-10-21 MED ORDER — ACETAMINOPHEN-CODEINE 300-30 MG PO TABS: 1.0000 | ORAL_TABLET | ORAL | Status: DC | PRN

## 2014-10-21 NOTE — Patient Instructions (Signed)
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
## 2014-10-22 ENCOUNTER — Telehealth: Payer: Self-pay | Admitting: Family Medicine

## 2014-10-22 NOTE — Telephone Encounter (Signed)
Unable to leave a message since voicemail was full. Will  try again later. I wanted to tell him about his official x-ray results. He was not actually having pain at the before meals joint. He was having pain on the posterior lateral aspect of his shoulder. Would like for him to come back and get rechecked or get referred to withhold if pain is not improved in one week.

## 2014-10-25 ENCOUNTER — Ambulatory Visit (INDEPENDENT_AMBULATORY_CARE_PROVIDER_SITE_OTHER): Payer: PRIVATE HEALTH INSURANCE | Admitting: Emergency Medicine

## 2014-10-25 VITALS — BP 140/90 | HR 65 | Temp 98.7°F | Ht 73.75 in | Wt 215.2 lb

## 2014-10-25 DIAGNOSIS — M542 Cervicalgia: Secondary | ICD-10-CM | POA: Diagnosis not present

## 2014-10-25 DIAGNOSIS — G894 Chronic pain syndrome: Secondary | ICD-10-CM

## 2014-10-25 DIAGNOSIS — S46011A Strain of muscle(s) and tendon(s) of the rotator cuff of right shoulder, initial encounter: Secondary | ICD-10-CM

## 2014-10-25 DIAGNOSIS — M25511 Pain in right shoulder: Secondary | ICD-10-CM

## 2014-10-25 MED ORDER — NAPROXEN SODIUM 550 MG PO TABS: 550.0000 mg | ORAL_TABLET | Freq: Two times a day (BID) | ORAL | Status: AC

## 2014-10-25 MED ORDER — ACETAMINOPHEN-CODEINE 300-30 MG PO TABS: 1.0000 | ORAL_TABLET | ORAL | Status: DC | PRN

## 2014-10-25 NOTE — Patient Instructions (Signed)

## 2014-10-25 NOTE — Progress Notes (Signed)
Subjective:  Patient ID: Scott Galloway, male    DOB: [birth year]  Age: 44 y.o. MRN: [id number]  CC: Shoulder Pain   HPI Scott Galloway presents for follow-up of his right shoulder injury. He was initially seen by Dr. Faustino Congress and then by Dr. Nedra Hai for right shoulder and right neck pain. He has no history of injury or overuse. He was initially treated with Percocet and later with Tylenol 3 and an meloxicam and Robaxin. He has exhausted his medication is concerned that he still having pain. He has no radiation of pain no numbness tingling or weakness.  Outpatient Prescriptions Prior to Visit  Medication Sig Dispense Refill  . gabapentin (NEURONTIN) 300 MG capsule 3 at bedtime and 1 in am 120 capsule 0  . omeprazole (PRILOSEC) 20 MG capsule Take 1 capsule (20 mg total) by mouth daily. 30 capsule 3  . Acetaminophen-Codeine 300-30 MG per tablet Take 1 tablet by mouth every 4 (four) hours as needed for pain. 30 tablet 0  . meloxicam (MOBIC) 15 MG tablet Take 1 tablet (15 mg total) by mouth daily. Take with food, no other NSAIDs. Max is 1 pill daily. 30 tablet 0  . methocarbamol (ROBAXIN) 500 MG tablet Take 1 tablet (500 mg total) by mouth every 6 (six) hours as needed for muscle spasms. Stop flexeril (Patient not taking: Reported on 10/25/2014) 30 tablet 0   No facility-administered medications prior to visit.    History   Social History  . Marital Status: Single    Spouse Name: N/A  . Number of Children: N/A  . Years of Education: N/A   Social History Main Topics  . Smoking status: Current Every Day Smoker -- 0.50 packs/day for 25 years    Types: Cigarettes  . Smokeless tobacco: Never Used  . Alcohol Use: 0.0 oz/week    0 Standard drinks or equivalent per week     Comment: 1/2 case a weekend  . Drug Use: No  . Sexual Activity: Yes   Other Topics Concern  . None   Social History Narrative    Family History  Problem Relation Age of Onset  . Hypertension Mother   . Colon cancer  Father   . Hypertension Father   . Prostate cancer Maternal Grandfather     Past Medical History  Diagnosis Date  . Alcohol abuse      Review of Systems  Constitutional: Negative for fever, chills and appetite change.  HENT: Negative for congestion, ear pain, postnasal drip, sinus pressure and sore throat.   Eyes: Negative for pain and redness.  Respiratory: Negative for cough, shortness of breath and wheezing.   Cardiovascular: Negative for leg swelling.  Gastrointestinal: Negative for nausea, vomiting, abdominal pain, diarrhea, constipation and blood in stool.  Endocrine: Negative for polyuria.  Genitourinary: Negative for dysuria, urgency, frequency and flank pain.  Musculoskeletal: Positive for neck pain and neck stiffness. Negative for gait problem.  Skin: Negative for rash.  Neurological: Negative for weakness and headaches.  Psychiatric/Behavioral: Negative for confusion and decreased concentration. The patient is not nervous/anxious.     Objective:  BP 140/90 mmHg  Pulse 65  Temp(Src) 98.7 F (37.1 C) (Oral)  Ht 6' 1.75" (1.873 m)  Wt 215 lb 3.2 oz (97.614 kg)  BMI 27.83 kg/m2  SpO2 98%  BP Readings from Last 3 Encounters:  10/25/14 140/90  10/21/14 126/98  10/17/14 130/84    Wt Readings from Last 3 Encounters:  10/25/14 215 lb 3.2 oz (97.614 kg)  10/21/14 216 lb (97.977 kg)  10/17/14 219 lb (99.338 kg)    Physical Exam  Constitutional: He is oriented to person, place, and time. He appears well-developed and well-nourished.  HENT:  Head: Normocephalic and atraumatic.  Eyes: Conjunctivae are normal. Pupils are equal, round, and reactive to light.  Pulmonary/Chest: Effort normal.  Musculoskeletal:       Right shoulder: He exhibits normal range of motion, no tenderness, no bony tenderness, no swelling, no crepitus, no deformity, no spasm and normal strength.       Right upper arm: He exhibits no tenderness, no bony tenderness, no swelling and no deformity.   Neurological: He is alert and oriented to person, place, and time.  Skin: Skin is dry.  Psychiatric: He has a normal mood and affect. His behavior is normal. Thought content normal.    Lab Results  Component Value Date   WBC 8.6 08/16/2013   HGB 15.0 08/16/2013   HCT 44.9 08/16/2013   PLT 325.0 08/16/2013   GLUCOSE 79 08/16/2013   CHOL 195 08/16/2013   TRIG 56.0 08/16/2013   HDL 44.00 08/16/2013   LDLCALC 140* 08/16/2013   ALT 16 08/16/2013   AST 19 08/16/2013   NA 140 08/16/2013   K 4.0 08/16/2013   CL 106 08/16/2013   CREATININE 1.2 08/16/2013   BUN 10 08/16/2013   CO2 27 08/16/2013   TSH 1.13 08/16/2013      .  Assessment & Plan:   Scott Galloway was seen today for shoulder pain.  Diagnoses and all orders for this visit:  Pain in joint, shoulder region, right Orders: -     Acetaminophen-Codeine 300-30 MG per tablet; Take 1 tablet by mouth every 4 (four) hours as needed for pain. -     Ambulatory referral to Orthopedic Surgery  Chronic pain syndrome  Neck pain Orders: -     Acetaminophen-Codeine 300-30 MG per tablet; Take 1 tablet by mouth every 4 (four) hours as needed for pain.  Rotator cuff (capsule) sprain and strain, right, initial encounter Orders: -     Acetaminophen-Codeine 300-30 MG per tablet; Take 1 tablet by mouth every 4 (four) hours as needed for pain.  Other orders -     naproxen sodium (ANAPROX DS) 550 MG tablet; Take 1 tablet (550 mg total) by mouth 2 (two) times daily with a meal.   I have discontinued Mr. Norris Crossurner's meloxicam. I am also having him start on naproxen sodium. Additionally, I am having him maintain his gabapentin, omeprazole, methocarbamol, and Acetaminophen-Codeine.  Meds ordered this encounter  Medications  . naproxen sodium (ANAPROX DS) 550 MG tablet    Sig: Take 1 tablet (550 mg total) by mouth 2 (two) times daily with a meal.    Dispense:  40 tablet    Refill:  0  . Acetaminophen-Codeine 300-30 MG per tablet    Sig: Take  1 tablet by mouth every 4 (four) hours as needed for pain.    Dispense:  30 tablet    Refill:  0   I've referred him to orthopedic surgery for evaluation and further treatment shoulder. I'm reluctant given a any additional narcotics due to his pending chronic pain management session  Follow-up: Return if symptoms worsen or fail to improve.  Carmelina DaneAnderson, Cebert Dettmann S, MD

## 2014-11-27 ENCOUNTER — Other Ambulatory Visit: Payer: Self-pay | Admitting: Neurosurgery

## 2014-12-10 ENCOUNTER — Inpatient Hospital Stay (HOSPITAL_COMMUNITY): Admission: RE | Admit: 2014-12-10 | Payer: PRIVATE HEALTH INSURANCE | Source: Ambulatory Visit

## 2014-12-16 ENCOUNTER — Inpatient Hospital Stay (HOSPITAL_COMMUNITY): Admission: RE | Admit: 2014-12-16 | Payer: PRIVATE HEALTH INSURANCE | Source: Ambulatory Visit | Admitting: Neurosurgery

## 2014-12-16 ENCOUNTER — Encounter (HOSPITAL_COMMUNITY): Admission: RE | Payer: Self-pay | Source: Ambulatory Visit

## 2014-12-16 SURGERY — POSTERIOR LUMBAR FUSION 1 LEVEL
Anesthesia: General

## 2015-01-09 ENCOUNTER — Ambulatory Visit (INDEPENDENT_AMBULATORY_CARE_PROVIDER_SITE_OTHER): Payer: PRIVATE HEALTH INSURANCE | Admitting: Internal Medicine

## 2015-01-09 VITALS — BP 124/80 | HR 86 | Temp 97.6°F | Resp 16 | Ht 73.75 in | Wt 214.0 lb

## 2015-01-09 DIAGNOSIS — I10 Essential (primary) hypertension: Secondary | ICD-10-CM

## 2015-01-09 DIAGNOSIS — R5383 Other fatigue: Secondary | ICD-10-CM | POA: Diagnosis not present

## 2015-01-09 DIAGNOSIS — M545 Low back pain, unspecified: Secondary | ICD-10-CM

## 2015-01-09 DIAGNOSIS — F17219 Nicotine dependence, cigarettes, with unspecified nicotine-induced disorders: Secondary | ICD-10-CM

## 2015-01-09 LAB — COMPREHENSIVE METABOLIC PANEL
ALT: 16 U/L (ref 9–46)
AST: 20 U/L (ref 10–40)
Albumin: 4.1 g/dL (ref 3.6–5.1)
Alkaline Phosphatase: 71 U/L (ref 40–115)
BUN: 10 mg/dL (ref 7–25)
CALCIUM: 9.4 mg/dL (ref 8.6–10.3)
CO2: 26 mmol/L (ref 20–31)
Chloride: 104 mmol/L (ref 98–110)
Creat: 0.99 mg/dL (ref 0.60–1.35)
GLUCOSE: 105 mg/dL — AB (ref 65–99)
POTASSIUM: 4 mmol/L (ref 3.5–5.3)
SODIUM: 143 mmol/L (ref 135–146)
Total Bilirubin: 0.6 mg/dL (ref 0.2–1.2)
Total Protein: 6.8 g/dL (ref 6.1–8.1)

## 2015-01-09 LAB — CBC WITH DIFFERENTIAL/PLATELET
BASOS PCT: 0 % (ref 0–1)
Basophils Absolute: 0 10*3/uL (ref 0.0–0.1)
Eosinophils Absolute: 0.1 10*3/uL (ref 0.0–0.7)
Eosinophils Relative: 2 % (ref 0–5)
HCT: 47.3 % (ref 39.0–52.0)
Hemoglobin: 16.2 g/dL (ref 13.0–17.0)
LYMPHS PCT: 28 % (ref 12–46)
Lymphs Abs: 2 10*3/uL (ref 0.7–4.0)
MCH: 30.1 pg (ref 26.0–34.0)
MCHC: 34.2 g/dL (ref 30.0–36.0)
MCV: 87.9 fL (ref 78.0–100.0)
MONO ABS: 0.6 10*3/uL (ref 0.1–1.0)
MONOS PCT: 8 % (ref 3–12)
MPV: 9.2 fL (ref 8.6–12.4)
NEUTROS ABS: 4.5 10*3/uL (ref 1.7–7.7)
Neutrophils Relative %: 62 % (ref 43–77)
Platelets: 333 10*3/uL (ref 150–400)
RBC: 5.38 MIL/uL (ref 4.22–5.81)
RDW: 14.8 % (ref 11.5–15.5)
WBC: 7.2 10*3/uL (ref 4.0–10.5)

## 2015-01-09 LAB — TSH: TSH: 0.751 u[IU]/mL (ref 0.350–4.500)

## 2015-01-09 MED ORDER — HYDROCHLOROTHIAZIDE 12.5 MG PO CAPS: 12.5000 mg | ORAL_CAPSULE | Freq: Every day | ORAL | Status: DC

## 2015-01-09 MED ORDER — VARENICLINE TARTRATE 0.5 MG X 11 & 1 MG X 42 PO MISC: ORAL | Status: DC

## 2015-01-09 NOTE — Progress Notes (Signed)
Subjective:    Patient ID: Scott Galloway, male    DOB: 03-30-1971, 44 y.o.   MRN: [id number] This chart was scribed for Scott Sia, MD by Jolene Provost, Medical Scribe. This patient was seen in Room 5 and the patient's care was started a 10:58 AM.  Chief Complaint  Patient presents with  . Hypertension    Checked at home and at chiopractor, highest 158/94  . Back Pain  . Nicotine Dependence    Wants help to quit smoking    HPI HPI Comments: Scott Galloway is a 44 y.o. male who presents to [facility] complaining of high blood pressure. Pt also endorses persistent fatigue, but states that may have to do with his pain medication. He denies associated CP or SOB. Elevated blood pressures documented at recent office visits. No past history of hypertension. No chest pain or palpitations. No prior cardiac disease. No edema.  The pt's neurosurgery Dr. encouraged him to talk to his PCP about helping him quit smoking.a few yrs ago he called 1-800-quit some time ago, and was sent patches and gum and was able to stay off cigarettes for 8 months, but he restarted recently. Pt did not have any issues with the gum while he was using it. Where he works is a very sweaty environment so the patches slid off when he tried to use them at work. 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 He is also attached to the activity of putting cigarettes in his mouth and needs an alternative activity.  He is also suffering from continued back pain, and was scheduled for surgery for his back, but declined the surgery due to concerns about the side effects of getting back surgery. Pt has been working with a [employer] for the last two months, which has improved his back pain. He has also bought a vibrating back massager which helps somewhat. His pain doctor is Dr. Ollen Bowl, but he sees the pain nurse Celedonio Miyamoto. OxyContin.  He also states he is feeling sluggish due to his pain  medication, and is wondering whether he can treat the sluggishness.   Review of Systems  Constitutional: Negative for fever and chills.       He describes fatigue over the years but feels it may be related to his work. He denies hypersomnolence he denies obst apnea.  Respiratory: Negative for chest tightness and shortness of breath.   Cardiovascular: Negative for chest pain and palpitations.  Musculoskeletal: Positive for back pain. Negative for gait problem.       Objective:   Physical Exam  Constitutional: He is oriented to person, place, and time. He appears well-developed and well-nourished. No distress.  HENT:  Head: Normocephalic and atraumatic.  Eyes: Conjunctivae and EOM are normal. Pupils are equal, round, and reactive to light.  Neck: Neck supple. No thyromegaly present.  Cardiovascular: Normal rate, regular rhythm, normal heart sounds and intact distal pulses.   No murmur heard. Pulmonary/Chest: Effort normal and breath sounds normal. No respiratory distress.  Musculoskeletal: Normal range of motion. He exhibits no edema.  Lymphadenopathy:    He has no cervical adenopathy.  Neurological: He is alert and oriented to person, place, and time. Coordination normal.  Skin: Skin is warm and dry. He is not diaphoretic.  Psychiatric: He has a normal mood and affect. His behavior is normal.  Nursing note and vitals reviewed.   Filed Vitals:   01/09/15 1018  BP: 124/80  Pulse: 86  Temp: 97.6 F (36.4 C)  TempSrc: Oral  Resp: 16  Height: 6' 1.75" (1.873 m)  Weight: 214 lb (97.07 kg)  SpO2: 98%       Assessment & Plan:  Essential hypertension - Plan: Comprehensive metabolic panel//start hydrochlorothiazide and follow blood pressures  Cigarette nicotine dependence with nicotine-induced disorder  Chantix/gum if needed at work/call for side effects  Other fatigue - Plan: CBC with Differential/Platelet, TSH  Low back pain, episodic--under neurosurgical care ,chronic pain  management and chiropractic  Meds ordered this encounter  Medications  . hydrochlorothiazide (MICROZIDE) 12.5 MG capsule    Sig: Take 1 capsule (12.5 mg total) by mouth daily.    Dispense:  90 capsule    Refill:  0  . varenicline (CHANTIX STARTING MONTH PAK) 0.5 MG X 11 & 1 MG X 42 tablet    Sig: Take one 0.5 mg tablet by mouth once daily for 3 days, then increase to one 0.5 mg tablet twice daily for 4 days, then increase to one 1 mg tablet twice daily.    Dispense:  53 tablet    Refill:  0   Notify follow-up after labs He will need eventual appointment 104 for CPE and health maintenance issues  I have completed the patient encounter in its entirety as documented by the scribe, with editing by me where necessary. Scott Seith P. Scott Galloway, M.D.

## 2015-01-10 ENCOUNTER — Encounter: Payer: Self-pay | Admitting: Internal Medicine

## 2015-01-22 ENCOUNTER — Other Ambulatory Visit: Payer: Self-pay | Admitting: Internal Medicine

## 2015-04-20 ENCOUNTER — Ambulatory Visit (INDEPENDENT_AMBULATORY_CARE_PROVIDER_SITE_OTHER): Payer: PRIVATE HEALTH INSURANCE

## 2015-04-20 ENCOUNTER — Ambulatory Visit (INDEPENDENT_AMBULATORY_CARE_PROVIDER_SITE_OTHER): Payer: PRIVATE HEALTH INSURANCE | Admitting: Emergency Medicine

## 2015-04-20 VITALS — BP 130/80 | HR 94 | Temp 99.4°F | Resp 17 | Ht 72.5 in | Wt 212.0 lb

## 2015-04-20 DIAGNOSIS — M25532 Pain in left wrist: Secondary | ICD-10-CM

## 2015-04-20 LAB — POCT CBC
GRANULOCYTE PERCENT: 61.8 % (ref 37–80)
HEMATOCRIT: 47.1 % (ref 43.5–53.7)
Hemoglobin: 16.2 g/dL (ref 14.1–18.1)
Lymph, poc: 2.7 (ref 0.6–3.4)
MCH, POC: 30.2 pg (ref 27–31.2)
MCHC: 34.4 g/dL (ref 31.8–35.4)
MCV: 87.9 fL (ref 80–97)
MID (CBC): 0.7 (ref 0–0.9)
MPV: 7.1 fL (ref 0–99.8)
POC GRANULOCYTE: 5.5 (ref 2–6.9)
POC LYMPH %: 30.6 % (ref 10–50)
POC MID %: 7.6 % (ref 0–12)
Platelet Count, POC: 297 10*3/uL (ref 142–424)
RBC: 5.36 M/uL (ref 4.69–6.13)
RDW, POC: 14.5 %
WBC: 8.9 10*3/uL (ref 4.6–10.2)

## 2015-04-20 LAB — URIC ACID: URIC ACID, SERUM: 5.9 mg/dL (ref 4.0–7.8)

## 2015-04-20 LAB — POCT SEDIMENTATION RATE: POCT SED RATE: 20 mm/hr (ref 0–22)

## 2015-04-20 MED ORDER — NAPROXEN SODIUM 550 MG PO TABS: 550.0000 mg | ORAL_TABLET | Freq: Two times a day (BID) | ORAL | Status: AC

## 2015-04-20 MED ORDER — HYDROCODONE-ACETAMINOPHEN 5-325 MG PO TABS: 1.0000 | ORAL_TABLET | ORAL | Status: DC | PRN
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
## 2015-04-20 NOTE — Patient Instructions (Signed)
Wrist Pain There are many things that can cause wrist pain. Some common causes include:  An injury to the wrist area, such as a sprain, strain, or fracture.  Overuse of the joint.  A condition that causes increased pressure on a nerve in the wrist (carpal tunnel syndrome).  Wear and tear of the joints that occurs with aging (osteoarthritis).  A variety of other types of arthritis. Sometimes, the cause of wrist pain is not known. The pain often goes away when you follow your health care provider's instructions for relieving pain at home. If your wrist pain continues, tests may need to be done to diagnose your condition. HOME CARE INSTRUCTIONS Pay attention to any changes in your symptoms. Take these actions to help with your pain:  Rest the wrist area for at least 48 hours or as told by your health care provider.  If directed, apply ice to the injured area:  Put ice in a plastic bag.  Place a towel between your skin and the bag.  Leave the ice on for 20 minutes, 2-3 times per day.  Keep your arm raised (elevated) above the level of your heart while you are sitting or lying down.  If a splint or elastic bandage has been applied, use it as told by your health care provider.  Remove the splint or bandage only as told by your health care provider.  Loosen the splint or bandage if your fingers become numb or have a tingling feeling, or if they turn cold or blue.  Take over-the-counter and prescription medicines only as told by your health care provider.  Keep all follow-up visits as told by your health care provider. This is important. SEEK MEDICAL CARE IF:  Your pain is not helped by treatment.  Your pain gets worse. SEEK IMMEDIATE MEDICAL CARE IF:  Your fingers become swollen.  Your fingers turn white, very red, or cold and blue.  Your fingers are numb or have a tingling feeling.  You have difficulty moving your fingers.   This information is not intended to replace  advice given to you by your health care provider. Make sure you discuss any questions you have with your health care provider.   Document Released: 02/23/2005 Document Revised: 02/04/2015 Document Reviewed: 10/01/2014 Elsevier Interactive Patient Education 2016 Elsevier Inc.  

## 2015-06-26 ENCOUNTER — Other Ambulatory Visit: Payer: Self-pay | Admitting: Internal Medicine

## 2016-05-28 ENCOUNTER — Ambulatory Visit (INDEPENDENT_AMBULATORY_CARE_PROVIDER_SITE_OTHER): Payer: PRIVATE HEALTH INSURANCE | Admitting: Family Medicine

## 2016-05-28 VITALS — BP 116/86 | HR 81 | Temp 98.4°F | Resp 16 | Ht 72.5 in | Wt 211.8 lb

## 2016-05-28 DIAGNOSIS — K219 Gastro-esophageal reflux disease without esophagitis: Secondary | ICD-10-CM

## 2016-05-28 DIAGNOSIS — F17200 Nicotine dependence, unspecified, uncomplicated: Secondary | ICD-10-CM | POA: Diagnosis not present

## 2016-05-28 MED ORDER — VARENICLINE TARTRATE 0.5 MG X 11 & 1 MG X 42 PO MISC: ORAL | 0 refills | Status: DC

## 2016-05-28 MED ORDER — VARENICLINE TARTRATE 1 MG PO TABS: 1.0000 mg | ORAL_TABLET | Freq: Every day | ORAL | 1 refills | Status: DC

## 2016-05-28 MED ORDER — OMEPRAZOLE 20 MG PO CPDR: 20.0000 mg | DELAYED_RELEASE_CAPSULE | Freq: Every day | ORAL | 3 refills | Status: DC

## 2016-05-28 NOTE — Progress Notes (Signed)
Subjective:  By signing my name below, I, Stann Oresung-Kai Tsai, attest that this documentation has been prepared under the direction and in the presence of Norberto SorensonEva Patricia Fargo, MD. Electronically Signed: Stann Oresung-Kai Tsai, Scribe. 05/28/2016 , 3:34 PM .  Patient was seen in Room 10 .   Patient ID: Scott Galloway, male    DOB: 01-30-1971, 45 y.o.   MRN: [id number] Chief Complaint  Patient presents with  . Medication Refill    Prilosec, chantix    HPI Scott Galloway is a 45 y.o. male who [facility] for medication refill of prilosec and chantix. Patient states he tried chantix to quit in the past and it worked for him. However, when he was drinking, he also started smoking while drinking. He wants to stop smoking again utilizing chantix again. His last dose of it was a year ago. He denies any side effects while on chantix. He denies history of depression. He also tried calling 1-800 smoking cessation hotline, and was on patches; however, it wasn't as effective.   He also requests prilosec refill. He was doing well on it but reflux return when he's on it. He tried Tums and other OTC medication without relief. He denies side effects with prilosec.   Past Medical History:  Diagnosis Date  . Alcohol abuse    Prior to Admission medications   Medication Sig Start Date End Date Taking? Authorizing Provider  CHANTIX 1 MG tablet TAKE ONE TABLET BY MOUTH ONCE DAILY 06/28/15  Yes Tonye Pearsonobert P Doolittle, MD  omeprazole (PRILOSEC) 20 MG capsule Take 1 capsule (20 mg total) by mouth daily. 10/17/14  Yes Elvina SidleKurt Lauenstein, MD  oxyCODONE-acetaminophen (PERCOCET) 10-325 MG per tablet Take 1 tablet by mouth every 6 (six) hours as needed for pain.   Yes Historical Provider, MD  Acetaminophen-Codeine 300-30 MG per tablet Take 1 tablet by mouth every 4 (four) hours as needed for pain. Patient not taking: Reported on 05/28/2016 10/25/14   Carmelina DaneJeffery S Anderson, MD  hydrochlorothiazide (MICROZIDE) 12.5 MG capsule Take 1 capsule (12.5  mg total) by mouth daily. Patient not taking: Reported on 05/28/2016 01/09/15   Tonye Pearsonobert P Doolittle, MD  HYDROcodone-acetaminophen [facility](NORCO) 5-325 MG tablet Take 1-2 tablets by mouth every 4 (four) hours as needed. Patient not taking: Reported on 05/28/2016 04/20/15   Carmelina DaneJeffery S Anderson, MD  OXYCONTIN 10 MG T12A 12 hr tablet TK 1 T PO  Q 12 H 11/03/14   Historical Provider, MD   No Known Allergies   Review of Systems  Constitutional: Negative for fatigue and unexpected weight change.  Eyes: Negative for visual disturbance.  Respiratory: Negative for cough, chest tightness and shortness of breath.   Cardiovascular: Negative for chest pain, palpitations and leg swelling.  Gastrointestinal: Negative for abdominal pain and blood in stool.  Neurological: Negative for dizziness, light-headedness and headaches.       Objective:   Physical Exam  Constitutional: He is oriented to person, place, and time. He appears well-developed and well-nourished. No distress.  HENT:  Head: Normocephalic and atraumatic.  Eyes: EOM are normal. Pupils are equal, round, and reactive to light.  Neck: Neck supple.  Cardiovascular: Normal rate, regular rhythm and normal heart sounds.   No murmur heard. Pulmonary/Chest: Effort normal and breath sounds normal. No respiratory distress. He has no wheezes.  Musculoskeletal: Normal range of motion.  Neurological: He is alert and oriented to person, place, and time.  Skin: Skin is warm and dry.  Psychiatric: He has a normal mood  and affect. His behavior is normal.  Nursing note and vitals reviewed.   BP 116/86 (BP Location: Right Arm, Patient Position: Sitting, Cuff Size: Small)   Pulse 81   Temp 98.4 F (36.9 C) (Oral)   Resp 16   Ht 6' 0.5" (1.842 m)   Wt 211 lb 12.8 oz (96.1 kg)   SpO2 98%   BMI 28.33 kg/m     Assessment & Plan:   1. Tobacco use disorder - has done well on chantix prior - last tried 1 yr prior, no h/o mental health concerns other than  alcohol/substance use. Failed patches and quitline  2. Gastroesophageal reflux disease without esophagitis - likely due to tob and EtOH use, refilled ppi. If sxs worsen or do not resolve with lifestyle improvement, RTC for further eval with H. Pylori testing.      Meds ordered this encounter  Medications  . varenicline (CHANTIX STARTING MONTH PAK) 0.5 MG X 11 & 1 MG X 42 tablet    Sig: Take 0.5 mg tab po qd x3 d, then increase to 0.5 mg bid x4 d, then increase to 1 mg tab bid.    Dispense:  53 tablet    Refill:  0  . varenicline (CHANTIX) 1 MG tablet    Sig: Take 1 tablet (1 mg total) by mouth daily.    Dispense:  180 tablet    Refill:  1  . omeprazole (PRILOSEC) 20 MG capsule    Sig: Take 1 capsule (20 mg total) by mouth daily.    Dispense:  90 capsule    Refill:  3    I personally performed the services described in this documentation, which was scribed in my presence. The recorded information has been reviewed and considered, and addended by me as needed.   Norberto SorensonEva Franklin Baumbach, M.D.  [facility]  [place] [address] [place], [place] [address] [phone] phone [phone] fax  05/28/16 5:23 PM

## 2016-05-28 NOTE — Patient Instructions (Addendum)
IF you received an x-ray today, you will receive an invoice from [facility]. Please contact [facility] at [phone] with questions or concerns regarding your invoice.   IF you received labwork today, you will receive an invoice from [facility]. Please contact [facility] at [phone] with questions or concerns regarding your invoice.   Our billing staff will not be able to assist you with questions regarding bills from these companies.  You will be contacted with the lab results as soon as they are available. The fastest way to get your results is to activate your My Chart account. Instructions are located on the last page of this paperwork. If you have not heard from us regarding the results in 2 weeks, please contact this office.     Steps to Quit Smoking Smoking tobacco can be harmful to your health and can affect almost every organ in your body. Smoking puts you, and those around you, at risk for developing many serious chronic diseases. Quitting smoking is difficult, but it is one of the best things that you can do for your health. It is never too late to quit. What are the benefits of quitting smoking? When you quit smoking, you lower your risk of developing serious diseases and conditions, such as:  Lung cancer or lung disease, such as COPD.  Heart disease.  Stroke.  Heart attack.  Infertility.  Osteoporosis and bone fractures. Additionally, symptoms such as coughing, wheezing, and shortness of breath may get better when you quit. You may also find that you get sick less often because your body is stronger at fighting off colds and infections. If you are pregnant, quitting smoking can help to reduce your chances of having a baby of low birth weight. How do I get ready to quit? When you decide to quit smoking, create a plan to make sure that you are successful. Before you quit:  Pick a date to quit. Set a date within the next two weeks to give you time  to prepare.  Write down the reasons why you are quitting. Keep this list in places where you will see it often, such as on your bathroom mirror or in your car or wallet.  Identify the people, places, things, and activities that make you want to smoke (triggers) and avoid them. Make sure to take these actions:  Throw away all cigarettes at home, at work, and in your car.  Throw away smoking accessories, such as Set designerashtrays and lighters.  Clean your car and make sure to empty the ashtray.  Clean your home, including curtains and carpets.  Tell your family, friends, and coworkers that you are quitting. Support from your loved ones can make quitting easier.  Talk with your health care provider about your options for quitting smoking.  Find out what treatment options are covered by your health insurance. What strategies can I use to quit smoking? Talk with your healthcare provider about different strategies to quit smoking. Some strategies include:  Quitting smoking altogether instead of gradually lessening how much you smoke over a period of time. Research shows that quitting "cold [place]key" is more successful than gradually quitting.  Attending in-person counseling to help you build problem-solving skills. You are more likely to have success in quitting if you attend several counseling sessions. Even short sessions of 10 minutes can be effective.  Finding resources and support systems that can help you to quit smoking and remain smoke-free after you quit. These resources are most helpful  when you use them often. They can include:  Online chats with a Veterinary surgeoncounselor.  Telephone quitlines.  Printed Materials engineerself-help materials.  Support groups or group counseling.  Text messaging programs.  Mobile phone applications.  Taking medicines to help you quit smoking. (If you are pregnant or breastfeeding, talk with your health care provider first.) Some medicines contain nicotine and some do not. Both types  of medicines help with cravings, but the medicines that include nicotine help to relieve withdrawal symptoms. Your health care provider may recommend:  Nicotine patches, gum, or lozenges.  Nicotine inhalers or sprays.  Non-nicotine medicine that is taken by mouth. Talk with your health care provider about combining strategies, such as taking medicines while you are also receiving in-person counseling. Using these two strategies together makes you more likely to succeed in quitting than if you used either strategy on its own. If you are pregnant or breastfeeding, talk with your health care provider about finding counseling or other support strategies to quit smoking. Do not take medicine to help you quit smoking unless told to do so by your health care provider. What things can I do to make it easier to quit? Quitting smoking might feel overwhelming at first, but there is a lot that you can do to make it easier. Take these important actions:  Reach out to your family and friends and ask that they support and encourage you during this time. Call telephone quitlines, reach out to support groups, or work with a counselor for support.  Ask people who smoke to avoid smoking around you.  Avoid places that trigger you to smoke, such as bars, parties, or smoke-break areas at work.  Spend time around people who do not smoke.  Lessen stress in your life, because stress can be a smoking trigger for some people. To lessen stress, try:  Exercising regularly.  Deep-breathing exercises.  Yoga.  Meditating.  Performing a body scan. This involves closing your eyes, scanning your body from head to toe, and noticing which parts of your body are particularly tense. Purposefully relax the muscles in those areas.  Download or purchase mobile phone or tablet apps (applications) that can help you stick to your quit plan by providing reminders, tips, and encouragement. There are many free apps, such as  QuitGuide from the [employer] developer(Centers for Disease Control and Prevention). You can find other support for quitting smoking (smoking cessation) through smokefree.gov and other websites. How will I feel when I quit smoking? Within the first 24 hours of quitting smoking, you may start to feel some withdrawal symptoms. These symptoms are usually most noticeable 2-3 days after quitting, but they usually do not last beyond 2-3 weeks. Changes or symptoms that you might experience include:  Mood swings.  Restlessness, anxiety, or irritation.  Difficulty concentrating.  Dizziness.  Strong cravings for sugary foods in addition to nicotine.  Mild weight gain.  Constipation.  Nausea.  Coughing or a sore throat.  Changes in how your medicines work in your body.  A depressed mood.  Difficulty sleeping (insomnia). After the first 2-3 weeks of quitting, you may start to notice more positive results, such as:  Improved sense of smell and taste.  Decreased coughing and sore throat.  Slower heart rate.  Lower blood pressure.  Clearer skin.  The ability to breathe more easily.  Fewer sick days. Quitting smoking is very challenging for most people. Do not get discouraged if you are not successful the first time. Some people need to  make many attempts to quit before they achieve long-term success. Do your best to stick to your quit plan, and talk with your health care provider if you have any questions or concerns. This information is not intended to replace advice given to you by your health care provider. Make sure you discuss any questions you have with your health care provider. Document Released: 05/10/2001 Document Revised: 01/12/2016 Document Reviewed: 09/30/2014 Elsevier Interactive Patient Education  2017 [employer].  Food Choices for Gastroesophageal Reflux Disease, Adult When you have gastroesophageal reflux disease (GERD), the foods you eat and your eating habits are very  important. Choosing the right foods can help ease the discomfort of GERD. What general guidelines do I need to follow?  Choose fruits, vegetables, whole grains, low-fat dairy products, and low-fat meat, fish, and poultry.  Limit fats such as oils, salad dressings, butter, nuts, and avocado.  Keep a food diary to identify foods that cause symptoms.  Avoid foods that cause reflux. These may be different for different people.  Eat frequent small meals instead of three large meals each day.  Eat your meals slowly, in a relaxed setting.  Limit fried foods.  Cook foods using methods other than frying.  Avoid drinking alcohol.  Avoid drinking large amounts of liquids with your meals.  Avoid bending over or lying down until 2-3 hours after eating. What foods are not recommended? The following are some foods and drinks that may worsen your symptoms: Vegetables  Tomatoes. Tomato juice. Tomato and spaghetti sauce. Chili peppers. Onion and garlic. Horseradish. Fruits  Oranges, grapefruit, and lemon (fruit and juice). Meats  High-fat meats, fish, and poultry. This includes hot dogs, ribs, ham, sausage, salami, and bacon. Dairy  Whole milk and chocolate milk. Sour cream. Cream. Butter. Ice cream. Cream cheese. Beverages  Coffee and tea, with or without caffeine. Carbonated beverages or energy drinks. Condiments  Hot sauce. Barbecue sauce. Sweets/Desserts  Chocolate and cocoa. Donuts. Peppermint and spearmint. Fats and Oils  High-fat foods, including [place] fries and potato chips. Other  Vinegar. Strong spices, such as black pepper, white pepper, red pepper, cayenne, curry powder, cloves, ginger, and chili powder. The items listed above may not be a complete list of foods and beverages to avoid. Contact your dietitian for more information.  This information is not intended to replace advice given to you by your health care provider. Make sure you discuss any questions you have with your  health care provider. Document Released: 05/16/2005 Document Revised: 10/22/2015 Document Reviewed: 03/20/2013 Elsevier Interactive Patient Education  2017 [employer].

## 2016-07-07 IMAGING — CR DG FEMUR 2V*L*
2 series · 2 of 2 positions shown · non-contrast
Comparison: None.

CLINICAL DATA: LEFT leg pain.  Initial encounter.

EXAM:
LEFT FEMUR - 2 VIEW

[AP]
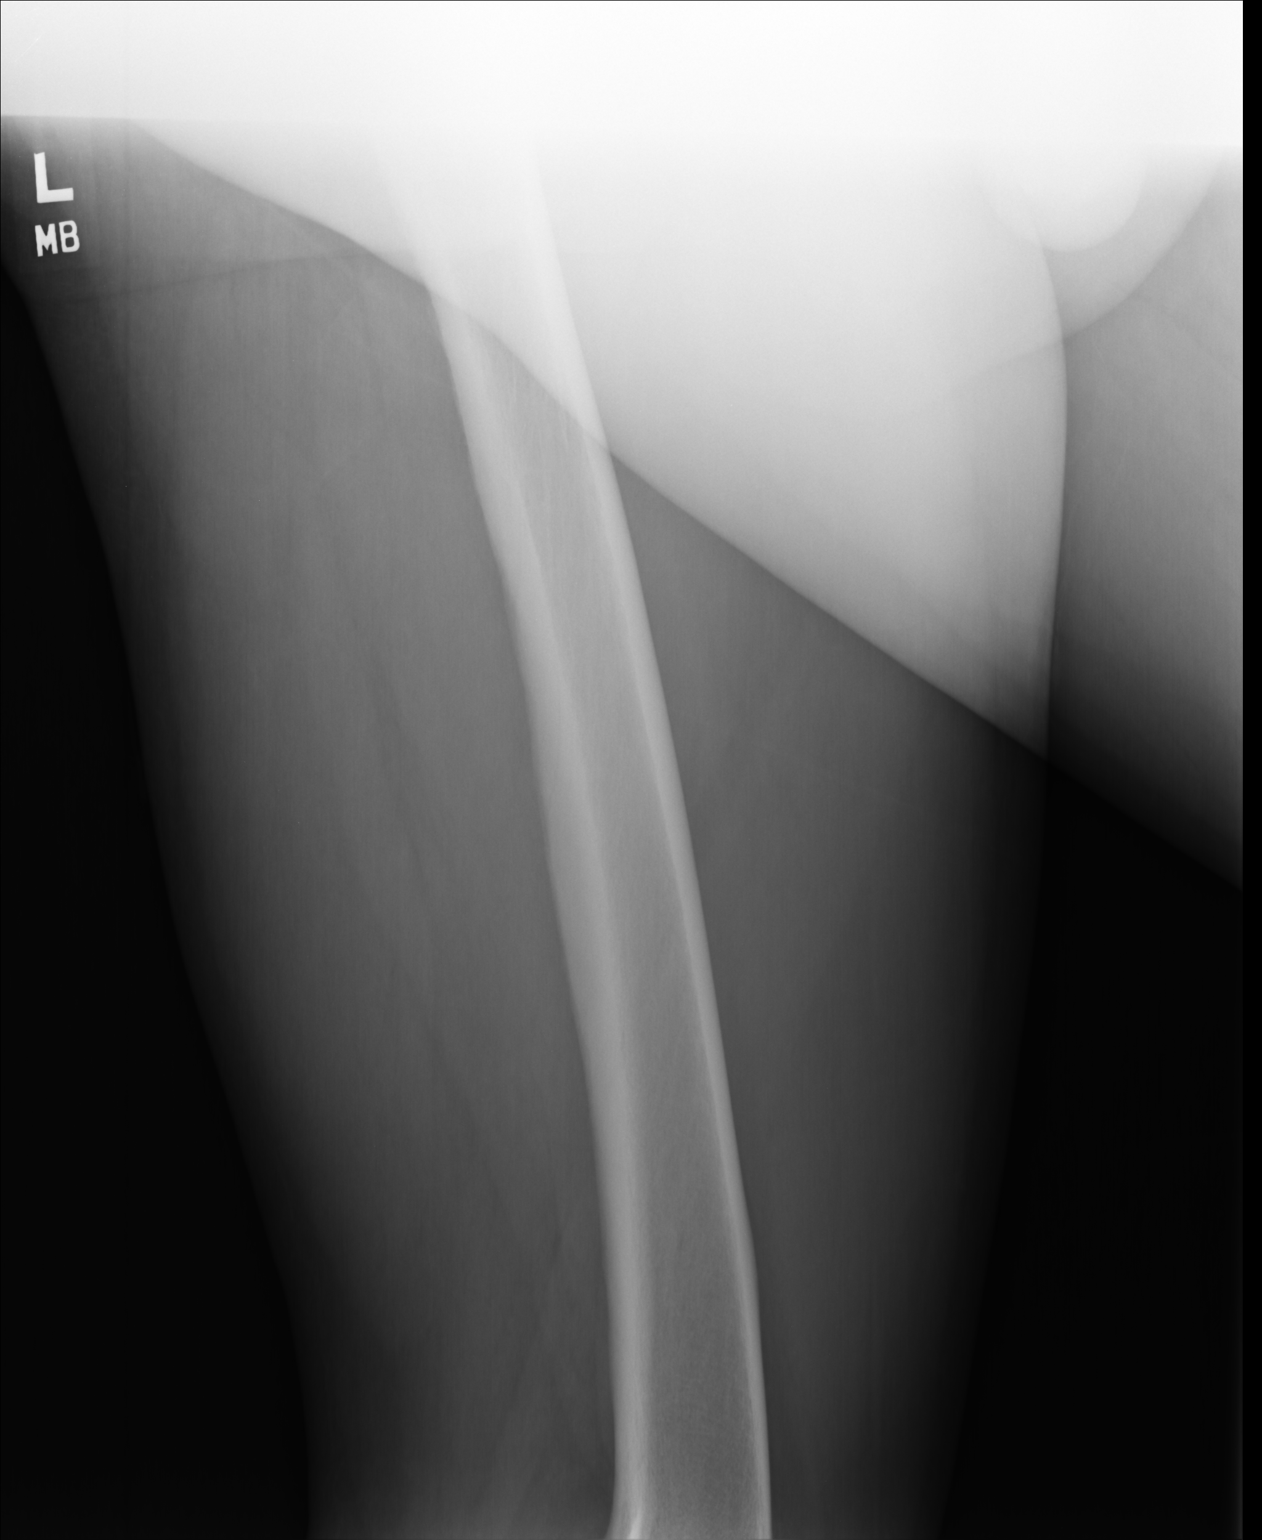

[lateral]
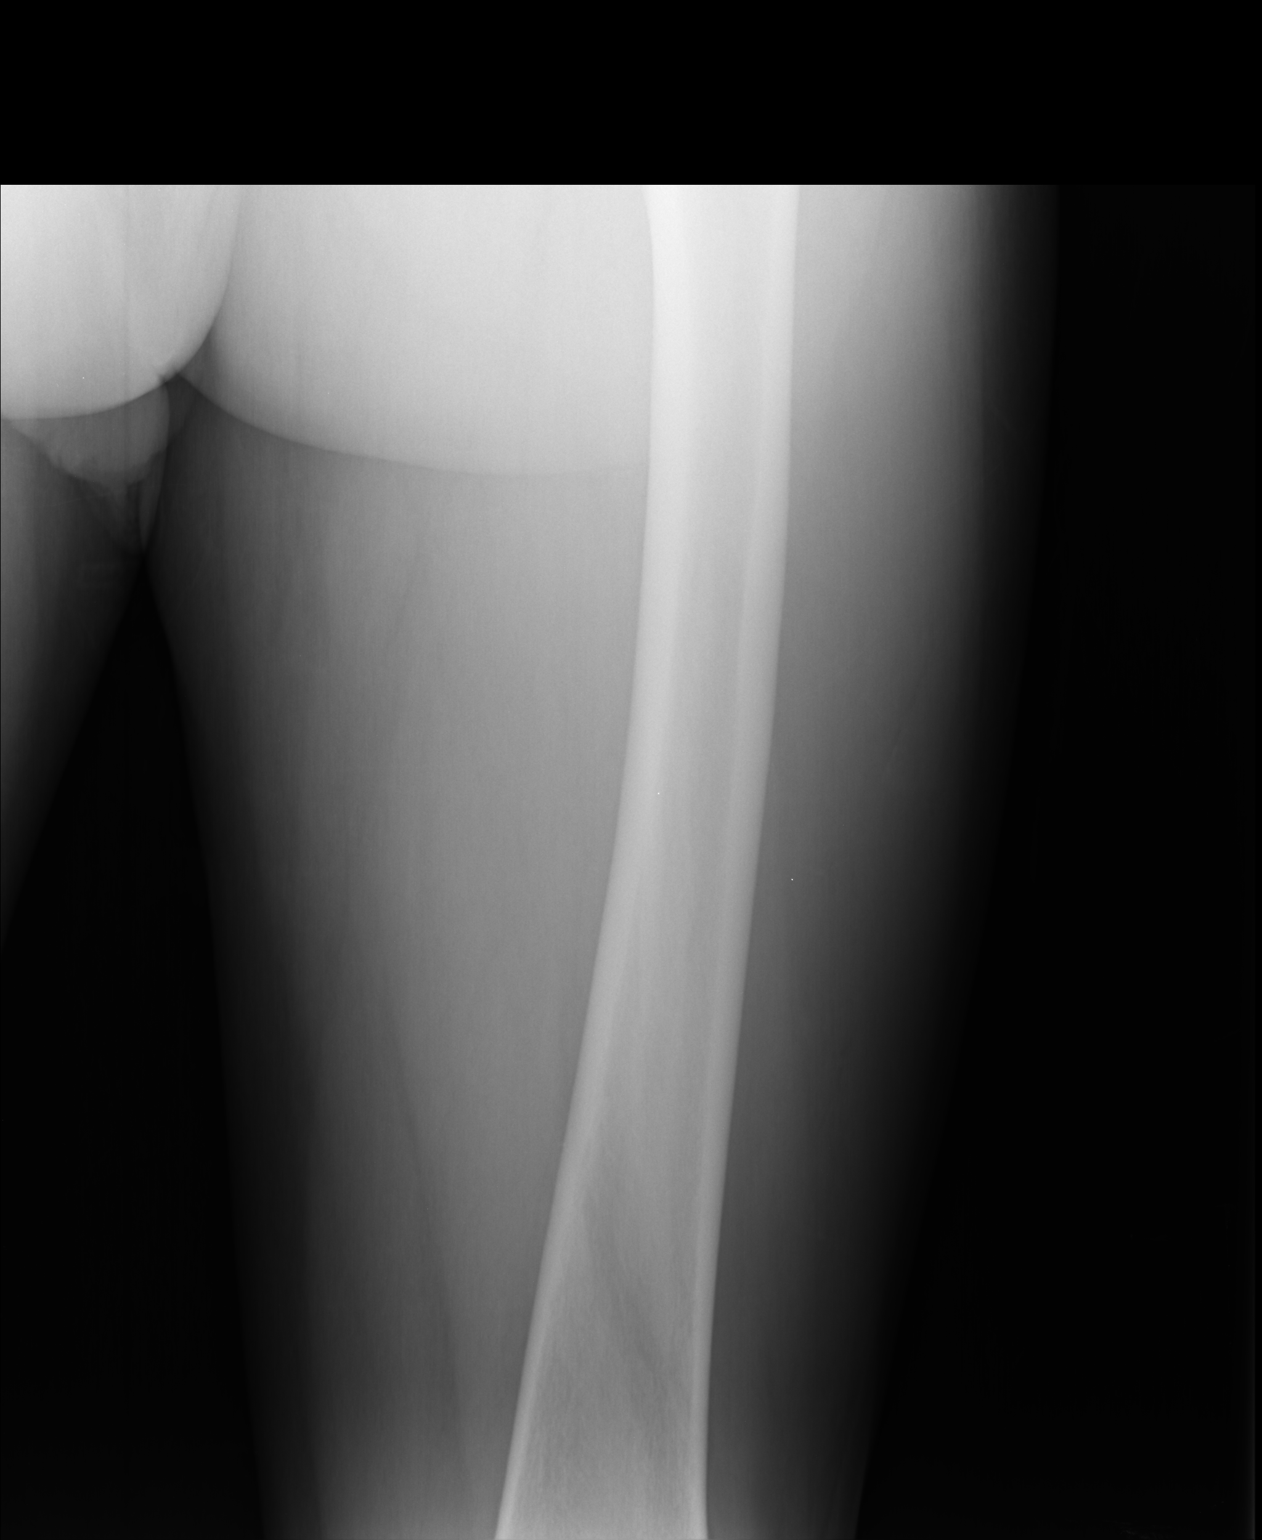

[2 of 2 positions shown; findings below may reference images not displayed]

FINDINGS: Visible portions of the LEFT femur appear normal. The proximal and
distal femur not visible on these radiographs. Soft tissues of the
LEFT thigh appear normal.
IMPRESSION: Incomplete visualization of the LEFT femur. Visible portions appear
normal.

## 2016-09-12 ENCOUNTER — Ambulatory Visit (INDEPENDENT_AMBULATORY_CARE_PROVIDER_SITE_OTHER): Payer: PRIVATE HEALTH INSURANCE | Admitting: Physician Assistant

## 2016-09-12 VITALS — BP 137/80 | HR 78 | Temp 97.7°F | Resp 17 | Ht 73.5 in | Wt 213.0 lb

## 2016-09-12 DIAGNOSIS — Z711 Person with feared health complaint in whom no diagnosis is made: Secondary | ICD-10-CM

## 2016-09-12 NOTE — Patient Instructions (Addendum)
In terms of elevated blood pressure, I would like you to check your blood pressure at least a couple times each week over the next four weeks and document these values. It is best if you check the blood pressure at different times in the day. Your goal is <140/90. If your values are consistently above this goal, please return to office for further evaluation. If you start to have chest pain, blurred vision, shortness of breath, severe headache, lower leg swelling, or nausea/vomiting please seek care immediately here or at the ED.   I would like you to return in 4 weeks for annual physical exam. Some goals before your next visit include: 1) eating less fast food 2) stop adding salt to every meal 3) increase vegetable, fruit, and water consumption, 4) start exercising at least a couple times a week 4) cut down on alcohol and cigarettes on the weekend. YOU CAN DO IT!!!!!!    DASH Eating Plan DASH stands for "Dietary Approaches to Stop Hypertension." The DASH eating plan is a healthy eating plan that has been shown to reduce high blood pressure (hypertension). It may also reduce your risk for type 2 diabetes, heart disease, and stroke. The DASH eating plan may also help with weight loss. What are tips for following this plan? General guidelines   Avoid eating more than 2,300 mg (milligrams) of salt (sodium) a day. If you have hypertension, you may need to reduce your sodium intake to 1,500 mg a day.  Limit alcohol intake to no more than 1 drink a day for nonpregnant women and 2 drinks a day for men. One drink equals 12 oz of beer, 5 oz of wine, or 1 oz of hard liquor.  Work with your health care provider to maintain a healthy body weight or to lose weight. Ask what an ideal weight is for you.  Get at least 30 minutes of exercise that causes your heart to beat faster (aerobic exercise) most days of the week. Activities may include walking, swimming, or biking.  Work with your health care provider or  diet and nutrition specialist (dietitian) to adjust your eating plan to your individual calorie needs. Reading food labels   Check food labels for the amount of sodium per serving. Choose foods with less than 5 percent of the Daily Value of sodium. Generally, foods with less than 300 mg of sodium per serving fit into this eating plan.  To find whole grains, look for the word "whole" as the first word in the ingredient list. Shopping   Buy products labeled as "low-sodium" or "no salt added."  Buy fresh foods. Avoid canned foods and premade or frozen meals. Cooking   Avoid adding salt when cooking. Use salt-free seasonings or herbs instead of table salt or sea salt. Check with your health care provider or pharmacist before using salt substitutes.  Do not fry foods. Cook foods using healthy methods such as baking, boiling, grilling, and broiling instead.  Cook with heart-healthy oils, such as olive, canola, soybean, or sunflower oil. Meal planning    Eat a balanced diet that includes:  5 or more servings of fruits and vegetables each day. At each meal, try to fill half of your plate with fruits and vegetables.  Up to 6-8 servings of whole grains each day.  Less than 6 oz of lean meat, poultry, or fish each day. A 3-oz serving of meat is about the same size as a deck of cards. One egg equals 1 oz.  2 servings of low-fat dairy each day.  A serving of nuts, seeds, or beans 5 times each week.  Heart-healthy fats. Healthy fats called Omega-3 fatty acids are found in foods such as flaxseeds and coldwater fish, like sardines, salmon, and mackerel.  Limit how much you eat of the following:  Canned or prepackaged foods.  Food that is high in trans fat, such as fried foods.  Food that is high in saturated fat, such as fatty meat.  Sweets, desserts, sugary drinks, and other foods with added sugar.  Full-fat dairy products.  Do not salt foods before eating.  Try to eat at least 2  vegetarian meals each week.  Eat more home-cooked food and less restaurant, buffet, and fast food.  When eating at a restaurant, ask that your food be prepared with less salt or no salt, if possible. What foods are recommended? The items listed may not be a complete list. Talk with your dietitian about what dietary choices are best for you. Grains  Whole-grain or whole-wheat bread. Whole-grain or whole-wheat pasta. Brown rice. [place]. Bulgur. Whole-grain and low-sodium cereals. Pita bread. Low-fat, low-sodium crackers. Whole-wheat flour tortillas. Vegetables  Fresh or frozen vegetables (raw, steamed, roasted, or grilled). Low-sodium or reduced-sodium tomato and vegetable juice. Low-sodium or reduced-sodium tomato sauce and tomato paste. Low-sodium or reduced-sodium canned vegetables. Fruits  All fresh, dried, or frozen fruit. Canned fruit in natural juice (without added sugar). Meat and other protein foods  Skinless chicken or [place]. Ground chicken or [place]. Pork with fat trimmed off. Fish and seafood. Egg whites. Dried beans, peas, or lentils. Unsalted nuts, nut butters, and seeds. Unsalted canned beans. Lean cuts of beef with fat trimmed off. Low-sodium, lean deli meat. Dairy  Low-fat (1%) or fat-free (skim) milk. Fat-free, low-fat, or reduced-fat cheeses. Nonfat, low-sodium ricotta or cottage cheese. Low-fat or nonfat yogurt. Low-fat, low-sodium cheese. Fats and oils  Soft margarine without trans fats. Vegetable oil. Low-fat, reduced-fat, or light mayonnaise and salad dressings (reduced-sodium). Canola, safflower, olive, soybean, and sunflower oils. Avocado. Seasoning and other foods  Herbs. Spices. Seasoning mixes without salt. Unsalted popcorn and pretzels. Fat-free sweets. What foods are not recommended? The items listed may not be a complete list. Talk with your dietitian about what dietary choices are best for you. Grains  Baked goods made with fat, such as croissants,  muffins, or some breads. Dry pasta or rice meal packs. Vegetables  Creamed or fried vegetables. Vegetables in a cheese sauce. Regular canned vegetables (not low-sodium or reduced-sodium). Regular canned tomato sauce and paste (not low-sodium or reduced-sodium). Regular tomato and vegetable juice (not low-sodium or reduced-sodium). Angie Fava. Olives. Fruits  Canned fruit in a light or heavy syrup. Fried fruit. Fruit in cream or butter sauce. Meat and other protein foods  Fatty cuts of meat. Ribs. Fried meat. Berniece Salines. Sausage. Bologna and other processed lunch meats. Salami. Fatback. Hotdogs. Bratwurst. Salted nuts and seeds. Canned beans with added salt. Canned or smoked fish. Whole eggs or egg yolks. Chicken or [place] with skin. Dairy  Whole or 2% milk, cream, and half-and-half. Whole or full-fat cream cheese. Whole-fat or sweetened yogurt. Full-fat cheese. Nondairy creamers. Whipped toppings. Processed cheese and cheese spreads. Fats and oils  Butter. Stick margarine. Lard. Shortening. Ghee. Bacon fat. Tropical oils, such as coconut, palm kernel, or palm oil. Seasoning and other foods  Salted popcorn and pretzels. Onion salt, garlic salt, seasoned salt, table salt, and sea salt. Worcestershire sauce. Tartar sauce. Barbecue sauce. Teriyaki sauce. Soy sauce,  including reduced-sodium. Steak sauce. Canned and packaged gravies. Fish sauce. Oyster sauce. Cocktail sauce. Horseradish that you find on the shelf. Ketchup. Mustard. Meat flavorings and tenderizers. Bouillon cubes. Hot sauce and Tabasco sauce. Premade or packaged marinades. Premade or packaged taco seasonings. Relishes. Regular salad dressings. Where to find more information:  National Heart, Lung, and Blood Institute: PopSteam.is  American Heart Association: www.heart.org Summary  The DASH eating plan is a healthy eating plan that has been shown to reduce high blood pressure (hypertension). It may also reduce your risk for type 2  diabetes, heart disease, and stroke.  With the DASH eating plan, you should limit salt (sodium) intake to 2,300 mg a day. If you have hypertension, you may need to reduce your sodium intake to 1,500 mg a day.  When on the DASH eating plan, aim to eat more fresh fruits and vegetables, whole grains, lean proteins, low-fat dairy, and heart-healthy fats.  Work with your health care provider or diet and nutrition specialist (dietitian) to adjust your eating plan to your individual calorie needs. This information is not intended to replace advice given to you by your health care provider. Make sure you discuss any questions you have with your health care provider. Document Released: 05/05/2011 Document Revised: 05/09/2016 Document Reviewed: 05/09/2016 Elsevier Interactive Patient Education  2017 [employer].  Eating Healthy on a Budget There are many ways to save money at the grocery store and continue to eat healthy. You can be successful if you plan your meals according to your budget, purchase according to your budget and grocery list, and prepare food yourself. How can I buy more food on a limited budget? Plan   Plan meals and snacks according to a grocery list and budget you create.  Look for recipes where you can cook once and make enough food for two meals.  Include meals that will "stretch" more expensive foods such as stews, casseroles, and stir-fry dishes.  Make a grocery list and make sure to bring it with you to the store. If you have a smart phone, you could use your phone to create your shopping list. Purchase   When grocery shopping, buy only the items on your grocery list and go only to the areas of the store that have the items on your list. Prepare   Some meal items can be prepared in advance. Pre-cook on days when you have extra time.  Make extra food (such as by doubling recipes) and freeze the extras in meal-sized containers or in individual portions for fast meals and  snacks.  Use leftovers in your meal plan for the week.  Try some meatless meals or try "no cook" meals like salads.  When you come home from the grocery store, wash and prepare your fruits and vegetables so they are ready to use and eat. This will help reduce food waste. How can I buy more food on a limited budget? Try these tips the next time you go shopping:  [place] store brands or generic brands.  Use coupons only for foods and brands you normally buy. Avoid buying items you wouldn't normally buy simply because they are on sale.  Check online and in newspapers for weekly deals.  Buy healthy items from the bulk bins when available, such as herbs, spices, flours, pastas, nuts, and dried fruit.  Buy fruits and vegetables that are in season. Prices are usually lower on in-season produce.  Compare and contrast different items. You can do this by looking at  the unit price on the price tag. Use it to compare different brands and sizes to find out which item is the best deal.  Choose naturally low-cost healthy items, such as carrots, potatoes, apples, bananas, and oranges. Dried or canned beans are a low-cost protein source.  Buy in bulk and freeze extra food. Items you can buy in bulk include meats, fish, poultry, frozen fruits, and frozen vegetables.  Limit the purchase of prepared or "ready-to-eat" foods, such as pre-cut fruits and vegetables and pre-made salads.  If possible, shop around to discover which grocery store offers the best prices. Some stores charge much more than other stores for the same items.  Do not shop when you are hungry. If you shop while hungry, It may be hard to stick to your list and budget.  Stick to your list and resist impulse buys. Treat your list as your official plan for the week.  Buy a variety of vegetables and fruit by purchasing fresh, frozen, and canned items.  Look beyond eye level. Foods at eye level (adult or child eye level) are more expensive.  Look at the top and bottom shelves for deals.  Be efficient with your time when shopping. The more time you spend at the store, the more money you are likely to spend.  Consider other retailers such as dollar stores, larger [employer], local fruit and vegetable stands, and farmers markets. What are some tips for less expensive food substitutions? When choosing more expensive foods like meats and dairy, try these tips to save money:  Choose cheaper cuts of meat, such as bone-in chicken thighs and drumsticks instead skinless and boneless chicken. When you are ready to prepare the chicken, you can remove the skin yourself to make it healthier.  Choose lean meats like chicken or [place]. When choosing ground beef, make sure it is lean ground beef (92% lean, 8% fat). If you do buy a fattier ground beef, drain the fat before eating.  Buy dried beans and peas, such as lentils, split peas, or kidney beans.  For seafood, choose canned tuna, salmon, or sardines.  Eggs are a low-cost source of protein.  Buy the larger tubs of yogurt instead of individual-sized containers.  Choose water instead of sodas and other sweetened beverages.  Skip buying chips, cookies, and other "junk food". These items are usually expensive, high in calories, and low in nutritional value. How can I prepare the foods I buy in the healthiest way? Practice these tips for cooking foods in the healthiest way to reduce excess fat and calorie intake:  Steam, saute, grill, or bake foods instead of frying them.  Make sure half your plate is filled with fruits or vegetables. Choose from fresh, frozen, or canned fruits and vegetables. If eating canned, remember to rinse them before eating. This will remove any excess salt added for packaging.  Trim all fat from meat before cooking. Remove the skin from chicken or [place].  Spoon off fat from meat dishes once they have been chilled in the refrigerator and the fat has hardened  on the top.  Use skim milk, low-fat milk, or evaporated skim milk when making cream sauces, soups, or puddings.  Substitute low-fat yogurt, sour cream, or cottage cheese for sour cream and mayonnaise in dips and dressings.  Try lemon juice, herbs, or spices to season food instead of salt, butter, or margarine. This information is not intended to replace advice given to you by your health care provider. Make sure you  discuss any questions you have with your health care provider. Document Released: 01/17/2014 Document Revised: 12/04/2015 Document Reviewed: 12/17/2013 Elsevier Interactive Patient Education  2017 [employer].  IF you received an x-ray today, you will receive an invoice from [facility]. Please contact [facility] at [phone] with questions or concerns regarding your invoice.   IF you received labwork today, you will receive an invoice from [facility]. Please contact [facility] at [phone] with questions or concerns regarding your invoice.   Our billing staff will not be able to assist you with questions regarding bills from these companies.  You will be contacted with the lab results as soon as they are available. The fastest way to get your results is to activate your My Chart account. Instructions are located on the last page of this paperwork. If you have not heard from [place] regarding the results in 2 weeks, please contact this office.
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
## 2016-12-02 ENCOUNTER — Encounter: Payer: Self-pay | Admitting: Physician Assistant

## 2016-12-02 ENCOUNTER — Ambulatory Visit (INDEPENDENT_AMBULATORY_CARE_PROVIDER_SITE_OTHER): Payer: PRIVATE HEALTH INSURANCE | Admitting: Physician Assistant

## 2016-12-02 ENCOUNTER — Telehealth: Payer: Self-pay | Admitting: Physician Assistant

## 2016-12-02 VITALS — BP 177/122 | HR 63 | Temp 98.6°F | Resp 16 | Ht 72.5 in | Wt 220.8 lb

## 2016-12-02 DIAGNOSIS — N489 Disorder of penis, unspecified: Secondary | ICD-10-CM

## 2016-12-02 DIAGNOSIS — A6001 Herpesviral infection of penis: Secondary | ICD-10-CM

## 2016-12-02 DIAGNOSIS — R21 Rash and other nonspecific skin eruption: Secondary | ICD-10-CM

## 2016-12-02 MED ORDER — VALACYCLOVIR HCL 1 G PO TABS: 1000.0000 mg | ORAL_TABLET | Freq: Two times a day (BID) | ORAL | 0 refills | Status: DC

## 2016-12-02 NOTE — Progress Notes (Signed)
   Scott GrieveWarren O Galloway  MRN: [id number] DOB: 07-24-1970  PCP: Patient, No Pcp Per  Subjective:  Pt is a 46 year old male who presents to clinic for rash on penis x 3 weeks.  Rash started on the head of his penis and has spread on the top toward the shaft. Had unprotected sex 2 weeks ago. Received oral sex about 3.5 weeks ago. Denies discharge, pain, tenderness, drainage, fever, chills. He has not been sick recently. Has been using vasoline - not helping. Admits to recent life stressors in the past month which include his family.     Review of Systems  Constitutional: Negative for chills, diaphoresis, fatigue and fever.  Genitourinary: Positive for genital sores. Negative for discharge, dysuria, frequency, penile pain, penile swelling, scrotal swelling, testicular pain and urgency.    Patient Active Problem List   Diagnosis Date Noted  . Chronic pain syndrome 10/25/2014  . Low back pain, episodic 09/05/2013  . Smoker 08/13/2013  . Constipation 08/13/2013  . GERD (gastroesophageal reflux disease) 08/13/2013    Current Outpatient Prescriptions on File Prior to Visit  Medication Sig Dispense Refill  . oxyCODONE-acetaminophen (PERCOCET) 10-325 MG per tablet Take 1 tablet by mouth every 6 (six) hours as needed for pain.    . varenicline (CHANTIX STARTING MONTH PAK) 0.5 MG X 11 & 1 MG X 42 tablet Take 0.5 mg tab po qd x3 d, then increase to 0.5 mg bid x4 d, then increase to 1 mg tab bid. 53 tablet 0  . varenicline (CHANTIX) 1 MG tablet Take 1 tablet (1 mg total) by mouth daily. 180 tablet 1  . omeprazole (PRILOSEC) 20 MG capsule Take 1 capsule (20 mg total) by mouth daily. (Patient not taking: Reported on 12/02/2016) 90 capsule 3   No current facility-administered medications on file prior to visit.     No Known Allergies   Objective:  BP (!) 172/108   Pulse 63   Temp 98.6 F (37 C)   Resp 16   Ht 6' 0.5" (1.842 m)   Wt 220 lb 12.8 oz (100.2 kg)   SpO2 99%   BMI 29.53 kg/m    Physical Exam  Constitutional: He is oriented to person, place, and time and well-developed, well-nourished, and in no distress. No distress.  Cardiovascular: Normal rate, regular rhythm and normal heart sounds.   Genitourinary: Testes/scrotum normal. No discharge found.  Genitourinary Comments: No vesicles present. Erythematous, confluent lesion involving the proximal head of his penis and distal shaft. No TTP. No drainage, weeping.   Neurological: He is alert and oriented to person, place, and time. GCS score is 15.  Skin: Skin is warm and dry.  Psychiatric: Mood, memory, affect and judgment normal.  Vitals reviewed.   Assessment and Plan :  1. Rash of penis 2. Herpes simplex infection of penis - Herpes simplex virus culture - valACYclovir (VALTREX) 1000 MG tablet; Take 1 tablet (1,000 mg total) by mouth 2 (two) times daily.  Dispense: 20 tablet; Refill: 0 - Rash is suspicious for herpes. Will treat. Culture is pending. Plan to contact with results. He agrees with plan.   Marco CollieWhitney Shelvy Heckert, PA-C  [facility] 12/02/2016 4:57 PM

## 2016-12-02 NOTE — Patient Instructions (Addendum)
You will be contacted as soon as we get the results of today's culture.  Valacyclovir: 1 g twice daily for 10 days  Thank you for coming in today. I hope you feel we met your needs.  Feel free to call [facility] if you have any questions or further requests.  Please consider signing up for MyChart if you do not already have it, as this is a great way to communicate with me.  Best,  Whitney McVey, PA-C  Genital Herpes Genital herpes is a common sexually transmitted infection (STI) that is caused by a virus. The virus spreads from person to person through sexual contact. Infection can cause itching, blisters, and sores around the genitals or rectum. Symptoms may last several days and then go away This is called an outbreak. However, the virus remains in your body, so you may have more outbreaks in the future. The time between outbreaks varies and can be months or years. Genital herpes affects men and women. It is particularly concerning for pregnant women because the virus can be passed to the baby during delivery and can cause serious problems. Genital herpes is also a concern for people who have a weak disease-fighting (immune) system. What are the causes? This condition is caused by the herpes simplex virus (HSV) type 1 or type 2. The virus may spread through:  Sexual contact with an infected person, including vaginal, anal, and oral sex.  Contact with fluid from a herpes sore.  The skin. This means that you can get herpes from an infected partner even if he or she does not have a visible sore or does not know that he or she is infected.  What increases the risk? You are more likely to develop this condition if:  You have sex with many partners.  You do not use latex condoms during sex.  What are the signs or symptoms? Most people do not have symptoms (asymptomatic) or have mild symptoms that may be mistaken for other skin problems. Symptoms may include:  Small red bumps near the genitals,  rectum, or mouth. These bumps turn into blisters and then turn into sores.  Flu-like symptoms, including: ? Fever. ? Body aches. ? Swollen lymph nodes. ? Headache.  Painful urination.  Pain and itching in the genital area or rectal area.  Vaginal discharge.  Tingling or shooting pain in the legs and buttocks.  Generally, symptoms are more severe and last longer during the first (primary) outbreak. Flu-like symptoms are also more common during the primary outbreak. How is this diagnosed? Genital herpes may be diagnosed based on:  A physical exam.  Your medical history.  Blood tests.  A test of a fluid sample (culture) from an open sore.  How is this treated? There is no cure for this condition, but treatment with antiviral medicines that are taken by mouth (orally) can do the following:  Speed up healing and relieve symptoms.  Help to reduce the spread of the virus to sexual partners.  Limit the chance of future outbreaks, or make future outbreaks shorter.  Lessen symptoms of future outbreaks.  Your health care provider may also recommend pain relief medicines, such as aspirin or ibuprofen. Follow these instructions at home: Sexual activity  Do not have sexual contact during active outbreaks.  Practice safe sex. Latex condoms and male condoms may help prevent the spread of the herpes virus. General instructions  Keep the affected areas dry and clean.  Take over-the-counter and prescription medicines only as told  by your health care provider.  Avoid rubbing or touching blisters and sores. If you do touch blisters or sores: ? Wash your hands thoroughly with soap and water. ? Do not touch your eyes afterward.  To help relieve pain or itching, you may take the following actions as directed by your health care provider: ? Apply a cold, wet cloth (cold compress) to affected areas 4-6 times a day. ? Apply a substance that protects your skin and reduces bleeding  (astringent). ? Apply a gel that helps relieve pain around sores (lidocaine gel). ? Take a warm, shallow bath that cleans the genital area (sitz bath).  Keep all follow-up visits as told by your health care provider. This is important. How is this prevented?  Use condoms. Although anyone can get genital herpes during sexual contact, even with the use of a condom, a condom can provide some protection.  Avoid having multiple sexual partners.  Talk with your sexual partner about any symptoms either of you may have. Also, talk with your partner about any history of STIs.  Get tested for STIs before you have sex. Ask your partner to do the same.  Do not have sexual contact if you have symptoms of genital herpes. Contact a health care provider if:  Your symptoms are not improving with medicine.  Your symptoms return.  You have new symptoms.  You have a fever.  You have abdominal pain.  You have redness, swelling, or pain in your eye.  You notice new sores on other parts of your body.  You are a woman and experience bleeding between menstrual periods.  You have had herpes and you become pregnant or plan to become pregnant. Summary  Genital herpes is a common sexually transmitted infection (STI) that is caused by the herpes simplex virus (HSV) type 1 or type 2.  These viruses are most often spread through sexual contact with an infected person.  You are more likely to develop this condition if you have sex with many partners or you have unprotected sex.  Most people do not have symptoms (asymptomatic) or have mild symptoms that may be mistaken for other skin problems. Symptoms occur as outbreaks that may happen months or years apart.  There is no cure for this condition, but treatment with oral antiviral medicines can reduce symptoms, reduce the chance of spreading the virus to a partner, prevent future outbreaks, or shorten future outbreaks. This information is not intended to  replace advice given to you by your health care provider. Make sure you discuss any questions you have with your health care provider. Document Released: 05/13/2000 Document Revised: 04/15/2016 Document Reviewed: 04/15/2016 Elsevier Interactive Patient Education  2017 [employer].   IF you received an x-ray today, you will receive an invoice from [facility]. Please contact [facility] at [phone] with questions or concerns regarding your invoice.   IF you received labwork today, you will receive an invoice from [facility]. Please contact [facility] at [phone] with questions or concerns regarding your invoice.   Our billing staff will not be able to assist you with questions regarding bills from these companies.  You will be contacted with the lab results as soon as they are available. The fastest way to get your results is to activate your My Chart account. Instructions are located on the last page of this paperwork. If you have not heard from [place] regarding the results in 2 weeks, please contact this office.

## 2016-12-02 NOTE — Telephone Encounter (Signed)
Adv pt Rx was sent to the [facility] on [address].

## 2016-12-04 LAB — HERPES SIMPLEX VIRUS CULTURE

## 2016-12-05 NOTE — Addendum Note (Signed)
Addended by: Sebastian AcheMCVEY, Amie Cowens WHITNEY on: 12/05/2016 06:04 PM   Modules accepted: Orders

## 2016-12-09 ENCOUNTER — Ambulatory Visit (INDEPENDENT_AMBULATORY_CARE_PROVIDER_SITE_OTHER): Payer: PRIVATE HEALTH INSURANCE | Admitting: Family Medicine

## 2016-12-09 DIAGNOSIS — N489 Disorder of penis, unspecified: Secondary | ICD-10-CM

## 2016-12-10 ENCOUNTER — Ambulatory Visit (INDEPENDENT_AMBULATORY_CARE_PROVIDER_SITE_OTHER): Payer: PRIVATE HEALTH INSURANCE | Admitting: Family Medicine

## 2016-12-10 ENCOUNTER — Encounter: Payer: Self-pay | Admitting: Family Medicine

## 2016-12-10 VITALS — BP 173/108 | HR 64 | Temp 98.6°F | Resp 16 | Ht 72.5 in | Wt 220.0 lb

## 2016-12-10 DIAGNOSIS — Z7289 Other problems related to lifestyle: Secondary | ICD-10-CM

## 2016-12-10 DIAGNOSIS — F17210 Nicotine dependence, cigarettes, uncomplicated: Secondary | ICD-10-CM

## 2016-12-10 DIAGNOSIS — I10 Essential (primary) hypertension: Secondary | ICD-10-CM | POA: Diagnosis not present

## 2016-12-10 DIAGNOSIS — Z720 Tobacco use: Secondary | ICD-10-CM | POA: Diagnosis not present

## 2016-12-10 DIAGNOSIS — Z789 Other specified health status: Secondary | ICD-10-CM

## 2016-12-10 DIAGNOSIS — R739 Hyperglycemia, unspecified: Secondary | ICD-10-CM | POA: Diagnosis not present

## 2016-12-10 LAB — RPR: RPR Ser Ql: NONREACTIVE

## 2016-12-10 LAB — HIV ANTIBODY (ROUTINE TESTING W REFLEX): HIV Screen 4th Generation wRfx: NONREACTIVE

## 2016-12-10 MED ORDER — AMLODIPINE BESYLATE 5 MG PO TABS: 5.0000 mg | ORAL_TABLET | Freq: Every day | ORAL | 1 refills | Status: DC

## 2016-12-10 NOTE — Progress Notes (Signed)
Please call pt and let him know he is negative for HIV and syphilis.  Thank you!

## 2016-12-10 NOTE — Patient Instructions (Addendum)
See diet recommendations below on lowering the sodium in diet, start amlodipine once per day, monitor your blood pressure outside the office and return in the next 2-3 weeks to discuss further.  Avoid alcohol as that may also run up your blood pressure. Avoid any illicit drugs including cocaine. If you have difficulty cutting back on alcohol or other cravings, let me know and I am happy to provide resources.  Ok to continue Chantix, let me know if a refill is needed. [place] offers smoking cessation clinics. Registration is required. To register call [phone] or register online at [email or web address].    Hypertension Hypertension, commonly called high blood pressure, is when the force of blood pumping through the arteries is too strong. The arteries are the blood vessels that carry blood from the heart throughout the body. Hypertension forces the heart to work harder to pump blood and may cause arteries to become narrow or stiff. Having untreated or uncontrolled hypertension can cause heart attacks, strokes, kidney disease, and other problems. A blood pressure reading consists of a higher number over a lower number. Ideally, your blood pressure should be below 120/80. The first ("top") number is called the systolic pressure. It is a measure of the pressure in your arteries as your heart beats. The second ("bottom") number is called the diastolic pressure. It is a measure of the pressure in your arteries as the heart relaxes. What are the causes? The cause of this condition is not known. What increases the risk? Some risk factors for high blood pressure are under your control. Others are not. Factors you can change  Smoking.  Having type 2 diabetes mellitus, high cholesterol, or both.  Not getting enough exercise or physical activity.  Being overweight.  Having too much fat, sugar, calories, or salt (sodium) in your diet.  Drinking too much alcohol. Factors that are difficult or  impossible to change  Having chronic kidney disease.  Having a family history of high blood pressure.  Age. Risk increases with age.  Race. You may be at higher risk if you are African-American.  Gender. Men are at higher risk than women before age 69. After age 57, women are at higher risk than men.  Having obstructive sleep apnea.  Stress. What are the signs or symptoms? Extremely high blood pressure (hypertensive crisis) may cause:  Headache.  Anxiety.  Shortness of breath.  Nosebleed.  Nausea and vomiting.  Severe chest pain.  Jerky movements you cannot control (seizures).  How is this diagnosed? This condition is diagnosed by measuring your blood pressure while you are seated, with your arm resting on a surface. The cuff of the blood pressure monitor will be placed directly against the skin of your upper arm at the level of your heart. It should be measured at least twice using the same arm. Certain conditions can cause a difference in blood pressure between your right and left arms. Certain factors can cause blood pressure readings to be lower or higher than normal (elevated) for a short period of time:  When your blood pressure is higher when you are in a health care provider's office than when you are at home, this is called white coat hypertension. Most people with this condition do not need medicines.  When your blood pressure is higher at home than when you are in a health care provider's office, this is called masked hypertension. Most people with this condition may need medicines to control blood pressure.  If you have a high  blood pressure reading during one visit or you have normal blood pressure with other risk factors:  You may be asked to return on a different day to have your blood pressure checked again.  You may be asked to monitor your blood pressure at home for 1 week or longer.  If you are diagnosed with hypertension, you may have other blood or  imaging tests to help your health care provider understand your overall risk for other conditions. How is this treated? This condition is treated by making healthy lifestyle changes, such as eating healthy foods, exercising more, and reducing your alcohol intake. Your health care provider may prescribe medicine if lifestyle changes are not enough to get your blood pressure under control, and if:  Your systolic blood pressure is above 130.  Your diastolic blood pressure is above 80.  Your personal target blood pressure may vary depending on your medical conditions, your age, and other factors. Follow these instructions at home: Eating and drinking  Eat a diet that is high in fiber and potassium, and low in sodium, added sugar, and fat. An example eating plan is called the DASH (Dietary Approaches to Stop Hypertension) diet. To eat this way: ? Eat plenty of fresh fruits and vegetables. Try to fill half of your plate at each meal with fruits and vegetables. ? Eat whole grains, such as whole wheat pasta, brown rice, or whole grain bread. Fill about one quarter of your plate with whole grains. ? Eat or drink low-fat dairy products, such as skim milk or low-fat yogurt. ? Avoid fatty cuts of meat, processed or cured meats, and poultry with skin. Fill about one quarter of your plate with lean proteins, such as fish, chicken without skin, beans, eggs, and tofu. ? Avoid premade and processed foods. These tend to be higher in sodium, added sugar, and fat.  Reduce your daily sodium intake. Most people with hypertension should eat less than 1,500 mg of sodium a day.  Limit alcohol intake to no more than 1 drink a day for nonpregnant women and 2 drinks a day for men. One drink equals 12 oz of beer, 5 oz of wine, or 1 oz of hard liquor. Lifestyle  Work with your health care provider to maintain a healthy body weight or to lose weight. Ask what an ideal weight is for you.  Get at least 30 minutes of  exercise that causes your heart to beat faster (aerobic exercise) most days of the week. Activities may include walking, swimming, or biking.  Include exercise to strengthen your muscles (resistance exercise), such as pilates or lifting weights, as part of your weekly exercise routine. Try to do these types of exercises for 30 minutes at least 3 days a week.  Do not use any products that contain nicotine or tobacco, such as cigarettes and e-cigarettes. If you need help quitting, ask your health care provider.  Monitor your blood pressure at home as told by your health care provider.  Keep all follow-up visits as told by your health care provider. This is important. Medicines  Take over-the-counter and prescription medicines only as told by your health care provider. Follow directions carefully. Blood pressure medicines must be taken as prescribed.  Do not skip doses of blood pressure medicine. Doing this puts you at risk for problems and can make the medicine less effective.  Ask your health care provider about side effects or reactions to medicines that you should watch for. Contact a health care provider if:  You think you are having a reaction to a medicine you are taking.  You have headaches that keep coming back (recurring).  You feel dizzy.  You have swelling in your ankles.  You have trouble with your vision. Get help right away if:  You develop a severe headache or confusion.  You have unusual weakness or numbness.  You feel faint.  You have severe pain in your chest or abdomen.  You vomit repeatedly.  You have trouble breathing. Summary  Hypertension is when the force of blood pumping through your arteries is too strong. If this condition is not controlled, it may put you at risk for serious complications.  Your personal target blood pressure may vary depending on your medical conditions, your age, and other factors. For most people, a normal blood pressure is  less than 120/80.  Hypertension is treated with lifestyle changes, medicines, or a combination of both. Lifestyle changes include weight loss, eating a healthy, low-sodium diet, exercising more, and limiting alcohol. This information is not intended to replace advice given to you by your health care provider. Make sure you discuss any questions you have with your health care provider. Document Released: 05/16/2005 Document Revised: 04/13/2016 Document Reviewed: 04/13/2016 Elsevier Interactive Patient Education  2018 [employer].   Steps to Quit Smoking Smoking tobacco can be harmful to your health and can affect almost every organ in your body. Smoking puts you, and those around you, at risk for developing many serious chronic diseases. Quitting smoking is difficult, but it is one of the best things that you can do for your health. It is never too late to quit. What are the benefits of quitting smoking? When you quit smoking, you lower your risk of developing serious diseases and conditions, such as:  Lung cancer or lung disease, such as COPD.  Heart disease.  Stroke.  Heart attack.  Infertility.  Osteoporosis and bone fractures.  Additionally, symptoms such as coughing, wheezing, and shortness of breath may get better when you quit. You may also find that you get sick less often because your body is stronger at fighting off colds and infections. If you are pregnant, quitting smoking can help to reduce your chances of having a baby of low birth weight. How do I get ready to quit? When you decide to quit smoking, create a plan to make sure that you are successful. Before you quit:  Pick a date to quit. Set a date within the next two weeks to give you time to prepare.  Write down the reasons why you are quitting. Keep this list in places where you will see it often, such as on your bathroom mirror or in your car or wallet.  Identify the people, places, things, and activities that  make you want to smoke (triggers) and avoid them. Make sure to take these actions: ? Throw away all cigarettes at home, at work, and in your car. ? Throw away smoking accessories, such as Set designer. ? Clean your car and make sure to empty the ashtray. ? Clean your home, including curtains and carpets.  Tell your family, friends, and coworkers that you are quitting. Support from your loved ones can make quitting easier.  Talk with your health care provider about your options for quitting smoking.  Find out what treatment options are covered by your health insurance.  What strategies can I use to quit smoking? Talk with your healthcare provider about different strategies to quit smoking. Some strategies include:  Quitting smoking altogether instead of gradually lessening how much you smoke over a period of time. Research shows that quitting "cold [place]" is more successful than gradually quitting.  Attending in-person counseling to help you build problem-solving skills. You are more likely to have success in quitting if you attend several counseling sessions. Even short sessions of 10 minutes can be effective.  Finding resources and support systems that can help you to quit smoking and remain smoke-free after you quit. These resources are most helpful when you use them often. They can include: ? Online chats with a Veterinary surgeon. ? Telephone quitlines. ? Automotive engineer. ? Support groups or group counseling. ? Text messaging programs. ? Mobile phone applications.  Taking medicines to help you quit smoking. (If you are pregnant or breastfeeding, talk with your health care provider first.) Some medicines contain nicotine and some do not. Both types of medicines help with cravings, but the medicines that include nicotine help to relieve withdrawal symptoms. Your health care provider may recommend: ? Nicotine patches, gum, or lozenges. ? Nicotine inhalers or  sprays. ? Non-nicotine medicine that is taken by mouth.  Talk with your health care provider about combining strategies, such as taking medicines while you are also receiving in-person counseling. Using these two strategies together makes you more likely to succeed in quitting than if you used either strategy on its own. If you are pregnant or breastfeeding, talk with your health care provider about finding counseling or other support strategies to quit smoking. Do not take medicine to help you quit smoking unless told to do so by your health care provider. What things can I do to make it easier to quit? Quitting smoking might feel overwhelming at first, but there is a lot that you can do to make it easier. Take these important actions:  Reach out to your family and friends and ask that they support and encourage you during this time. Call telephone quitlines, reach out to support groups, or work with a counselor for support.  Ask people who smoke to avoid smoking around you.  Avoid places that trigger you to smoke, such as bars, parties, or smoke-break areas at work.  Spend time around people who do not smoke.  Lessen stress in your life, because stress can be a smoking trigger for some people. To lessen stress, try: ? Exercising regularly. ? Deep-breathing exercises. ? Yoga. ? Meditating. ? Performing a body scan. This involves closing your eyes, scanning your body from head to toe, and noticing which parts of your body are particularly tense. Purposefully relax the muscles in those areas.  Download or purchase mobile phone or tablet apps (applications) that can help you stick to your quit plan by providing reminders, tips, and encouragement. There are many free apps, such as QuitGuide from the [employer] developer for Disease Control and Prevention). You can find other support for quitting smoking (smoking cessation) through smokefree.gov and other websites.  How will I feel when I quit  smoking? Within the first 24 hours of quitting smoking, you may start to feel some withdrawal symptoms. These symptoms are usually most noticeable 2-3 days after quitting, but they usually do not last beyond 2-3 weeks. Changes or symptoms that you might experience include:  Mood swings.  Restlessness, anxiety, or irritation.  Difficulty concentrating.  Dizziness.  Strong cravings for sugary foods in addition to nicotine.  Mild weight gain.  Constipation.  Nausea.  Coughing or a sore throat.  Changes in how your medicines  work in Public relations account executiveyour body.  A depressed mood.  Difficulty sleeping (insomnia).  After the first 2-3 weeks of quitting, you may start to notice more positive results, such as:  Improved sense of smell and taste.  Decreased coughing and sore throat.  Slower heart rate.  Lower blood pressure.  Clearer skin.  The ability to breathe more easily.  Fewer sick days.  Quitting smoking is very challenging for most people. Do not get discouraged if you are not successful the first time. Some people need to make many attempts to quit before they achieve long-term success. Do your best to stick to your quit plan, and talk with your health care provider if you have any questions or concerns. This information is not intended to replace advice given to you by your health care provider. Make sure you discuss any questions you have with your health care provider. Document Released: 05/10/2001 Document Revised: 01/12/2016 Document Reviewed: 09/30/2014 Elsevier Interactive Patient Education  2017 Elsevier Inc.   DASH Eating Plan DASH stands for "Dietary Approaches to Stop Hypertension." The DASH eating plan is a healthy eating plan that has been shown to reduce high blood pressure (hypertension). It may also reduce your risk for type 2 diabetes, heart disease, and stroke. The DASH eating plan may also help with weight loss. What are tips for following this plan? General  guidelines  Avoid eating more than 2,300 mg (milligrams) of salt (sodium) a day. If you have hypertension, you may need to reduce your sodium intake to 1,500 mg a day.  Limit alcohol intake to no more than 1 drink a day for nonpregnant women and 2 drinks a day for men. One drink equals 12 oz of beer, 5 oz of wine, or 1 oz of hard liquor.  Work with your health care provider to maintain a healthy body weight or to lose weight. Ask what an ideal weight is for you.  Get at least 30 minutes of exercise that causes your heart to beat faster (aerobic exercise) most days of the week. Activities may include walking, swimming, or biking.  Work with your health care provider or diet and nutrition specialist (dietitian) to adjust your eating plan to your individual calorie needs. Reading food labels  Check food labels for the amount of sodium per serving. Choose foods with less than 5 percent of the Daily Value of sodium. Generally, foods with less than 300 mg of sodium per serving fit into this eating plan.  To find whole grains, look for the word "whole" as the first word in the ingredient list. Shopping  Buy products labeled as "low-sodium" or "no salt added."  Buy fresh foods. Avoid canned foods and premade or frozen meals. Cooking  Avoid adding salt when cooking. Use salt-free seasonings or herbs instead of table salt or sea salt. Check with your health care provider or pharmacist before using salt substitutes.  Do not fry foods. Cook foods using healthy methods such as baking, boiling, grilling, and broiling instead.  Cook with heart-healthy oils, such as olive, canola, soybean, or sunflower oil. Meal planning   Eat a balanced diet that includes: ? 5 or more servings of fruits and vegetables each day. At each meal, try to fill half of your plate with fruits and vegetables. ? Up to 6-8 servings of whole grains each day. ? Less than 6 oz of lean meat, poultry, or fish each day. A 3-oz  serving of meat is about the same size as a deck of  cards. One egg equals 1 oz. ? 2 servings of low-fat dairy each day. ? A serving of nuts, seeds, or beans 5 times each week. ? Heart-healthy fats. Healthy fats called Omega-3 fatty acids are found in foods such as flaxseeds and coldwater fish, like sardines, salmon, and mackerel.  Limit how much you eat of the following: ? Canned or prepackaged foods. ? Food that is high in trans fat, such as fried foods. ? Food that is high in saturated fat, such as fatty meat. ? Sweets, desserts, sugary drinks, and other foods with added sugar. ? Full-fat dairy products.  Do not salt foods before eating.  Try to eat at least 2 vegetarian meals each week.  Eat more home-cooked food and less restaurant, buffet, and fast food.  When eating at a restaurant, ask that your food be prepared with less salt or no salt, if possible. What foods are recommended? The items listed may not be a complete list. Talk with your dietitian about what dietary choices are best for you. Grains Whole-grain or whole-wheat bread. Whole-grain or whole-wheat pasta. Brown rice. Orpah Cobb. Bulgur. Whole-grain and low-sodium cereals. Pita bread. Low-fat, low-sodium crackers. Whole-wheat flour tortillas. Vegetables Fresh or frozen vegetables (raw, steamed, roasted, or grilled). Low-sodium or reduced-sodium tomato and vegetable juice. Low-sodium or reduced-sodium tomato sauce and tomato paste. Low-sodium or reduced-sodium canned vegetables. Fruits All fresh, dried, or frozen fruit. Canned fruit in natural juice (without added sugar). Meat and other protein foods Skinless chicken or [place]. Ground chicken or [place]. Pork with fat trimmed off. Fish and seafood. Egg whites. Dried beans, peas, or lentils. Unsalted nuts, nut butters, and seeds. Unsalted canned beans. Lean cuts of beef with fat trimmed off. Low-sodium, lean deli meat. Dairy Low-fat (1%) or fat-free (skim) milk.  Fat-free, low-fat, or reduced-fat cheeses. Nonfat, low-sodium ricotta or cottage cheese. Low-fat or nonfat yogurt. Low-fat, low-sodium cheese. Fats and oils Soft margarine without trans fats. Vegetable oil. Low-fat, reduced-fat, or light mayonnaise and salad dressings (reduced-sodium). Canola, safflower, olive, soybean, and sunflower oils. Avocado. Seasoning and other foods Herbs. Spices. Seasoning mixes without salt. Unsalted popcorn and pretzels. Fat-free sweets. What foods are not recommended? The items listed may not be a complete list. Talk with your dietitian about what dietary choices are best for you. Grains Baked goods made with fat, such as croissants, muffins, or some breads. Dry pasta or rice meal packs. Vegetables Creamed or fried vegetables. Vegetables in a cheese sauce. Regular canned vegetables (not low-sodium or reduced-sodium). Regular canned tomato sauce and paste (not low-sodium or reduced-sodium). Regular tomato and vegetable juice (not low-sodium or reduced-sodium). Rosita Fire. Olives. Fruits Canned fruit in a light or heavy syrup. Fried fruit. Fruit in cream or butter sauce. Meat and other protein foods Fatty cuts of meat. Ribs. Fried meat. Tomasa Blase. Sausage. Bologna and other processed lunch meats. Salami. Fatback. Hotdogs. Bratwurst. Salted nuts and seeds. Canned beans with added salt. Canned or smoked fish. Whole eggs or egg yolks. Chicken or [place] with skin. Dairy Whole or 2% milk, cream, and half-and-half. Whole or full-fat cream cheese. Whole-fat or sweetened yogurt. Full-fat cheese. Nondairy creamers. Whipped toppings. Processed cheese and cheese spreads. Fats and oils Butter. Stick margarine. Lard. Shortening. Ghee. Bacon fat. Tropical oils, such as coconut, palm kernel, or palm oil. Seasoning and other foods Salted popcorn and pretzels. Onion salt, garlic salt, seasoned salt, table salt, and sea salt. Worcestershire sauce. Tartar sauce. Barbecue sauce. Teriyaki sauce.  Soy sauce, including reduced-sodium. Steak sauce. Canned and packaged  gravies. Fish sauce. Oyster sauce. Cocktail sauce. Horseradish that you find on the shelf. Ketchup. Mustard. Meat flavorings and tenderizers. Bouillon cubes. Hot sauce and Tabasco sauce. Premade or packaged marinades. Premade or packaged taco seasonings. Relishes. Regular salad dressings. Where to find more information:  National Heart, Lung, and Blood Institute: PopSteam.is  American Heart Association: www.heart.org Summary  The DASH eating plan is a healthy eating plan that has been shown to reduce high blood pressure (hypertension). It may also reduce your risk for type 2 diabetes, heart disease, and stroke.  With the DASH eating plan, you should limit salt (sodium) intake to 2,300 mg a day. If you have hypertension, you may need to reduce your sodium intake to 1,500 mg a day.  When on the DASH eating plan, aim to eat more fresh fruits and vegetables, whole grains, lean proteins, low-fat dairy, and heart-healthy fats.  Work with your health care provider or diet and nutrition specialist (dietitian) to adjust your eating plan to your individual calorie needs. This information is not intended to replace advice given to you by your health care provider. Make sure you discuss any questions you have with your health care provider. Document Released: 05/05/2011 Document Revised: 05/09/2016 Document Reviewed: 05/09/2016 Elsevier Interactive Patient Education  2017 [employer].   IF you received an x-ray today, you will receive an invoice from [facility]. Please contact [facility] at [phone] with questions or concerns regarding your invoice.   IF you received labwork today, you will receive an invoice from [facility]. Please contact [facility] at [phone] with questions or concerns regarding your invoice.   Our billing staff will not be able to assist you with questions regarding bills from these  companies.  You will be contacted with the lab results as soon as they are available. The fastest way to get your results is to activate your My Chart account. Instructions are located on the last page of this paperwork. If you have not heard from [place] regarding the results in 2 weeks, please contact this office.

## 2016-12-10 NOTE — Progress Notes (Signed)
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 salmon, and mackerel.  Limit how much you eat of the following: ? Canned or prepackaged foods. ? Food that is high in trans fat, such as fried foods. ? Food that is high in saturated fat, such as fatty meat. ? Sweets, desserts, sugary drinks, and other foods with added sugar. ? Full-fat dairy products.  Do not salt foods before eating.  Try to eat at least 2 vegetarian meals each week.  Eat more home-cooked food and less restaurant, buffet, and fast food.  When eating at a restaurant, ask that your food be prepared with less salt or no salt, if possible. What foods are recommended? The items listed may not be a complete list. Talk with your dietitian about what dietary choices are best for you. Grains Whole-grain or whole-wheat bread. Whole-grain or whole-wheat pasta. Brown rice. Orpah Cobb. Bulgur. Whole-grain and low-sodium cereals. Pita bread. Low-fat,  low-sodium crackers. Whole-wheat flour tortillas. Vegetables Fresh or frozen vegetables (raw, steamed, roasted, or grilled). Low-sodium or reduced-sodium tomato and vegetable juice. Low-sodium or reduced-sodium tomato sauce and tomato paste. Low-sodium or reduced-sodium canned vegetables. Fruits All fresh, dried, or frozen fruit. Canned fruit in natural juice (without added sugar). Meat and other protein foods Skinless chicken or [place]. Ground chicken or [place]. Pork with fat trimmed off. Fish and seafood. Egg whites. Dried beans, peas, or lentils. Unsalted nuts, nut butters, and seeds. Unsalted canned beans. Lean cuts of beef with fat trimmed off. Low-sodium, lean deli meat. Dairy Low-fat (1%) or fat-free (skim) milk. Fat-free, low-fat, or reduced-fat cheeses. Nonfat, low-sodium ricotta or cottage cheese. Low-fat or nonfat yogurt. Low-fat, low-sodium cheese. Fats and oils Soft margarine without trans fats. Vegetable oil. Low-fat, reduced-fat, or light mayonnaise and salad dressings (reduced-sodium). Canola, safflower, olive, soybean, and sunflower oils. Avocado. Seasoning and other foods Herbs. Spices. Seasoning mixes without salt. Unsalted popcorn and pretzels. Fat-free sweets. What foods are not recommended? The items listed may not be a complete list. Talk with your dietitian about what dietary choices are best for you. Grains Baked goods made with fat, such as croissants, muffins, or some breads. Dry pasta or rice meal packs. Vegetables Creamed or fried vegetables. Vegetables in a cheese sauce. Regular canned vegetables (not low-sodium or reduced-sodium). Regular canned tomato sauce and paste (not low-sodium or reduced-sodium). Regular tomato and vegetable juice (not low-sodium or reduced-sodium). Rosita Fire. Olives. Fruits Canned fruit in a light or heavy syrup. Fried fruit. Fruit in cream or butter sauce. Meat and other protein foods Fatty cuts of meat. Ribs. Fried meat. Tomasa Blase. Sausage.  Bologna and other processed lunch meats. Salami. Fatback. Hotdogs. Bratwurst. Salted nuts and seeds. Canned beans with added salt. Canned or smoked fish. Whole eggs or egg yolks. Chicken or [place] with skin. Dairy Whole or 2% milk, cream, and half-and-half. Whole or full-fat cream cheese. Whole-fat or sweetened yogurt. Full-fat cheese. Nondairy creamers. Whipped toppings. Processed cheese and cheese spreads. Fats and oils Butter. Stick margarine. Lard. Shortening. Ghee. Bacon fat. Tropical oils, such as coconut, palm kernel, or palm oil. Seasoning and other foods Salted popcorn and pretzels. Onion salt, garlic salt, seasoned salt, table salt, and sea  salt. Worcestershire sauce. Tartar sauce. Barbecue sauce. Teriyaki sauce. Soy sauce, including reduced-sodium. Steak sauce. Canned and packaged gravies. Fish sauce. Oyster sauce. Cocktail sauce. Horseradish that you find on the shelf. Ketchup. Mustard. Meat flavorings and tenderizers. Bouillon cubes. Hot sauce and Tabasco sauce. Premade or packaged marinades. Premade or packaged taco seasonings. Relishes. Regular salad dressings. Where to find more information:  National Heart, Lung, and Blood Institute: PopSteam.is  American Heart Association: www.heart.org Summary  The DASH eating plan is a healthy eating plan that has been shown to reduce high blood pressure (hypertension). It may also reduce your risk for type 2 diabetes, heart disease, and stroke.  With the DASH eating plan, you should limit salt (sodium) intake to 2,300 mg a day. If you have hypertension, you may need to reduce your sodium intake to 1,500 mg a day.  When on the DASH eating plan, aim to eat more fresh fruits and vegetables, whole grains, lean proteins, low-fat dairy, and heart-healthy fats.  Work with your health care provider or diet and nutrition specialist (dietitian) to adjust your eating plan to your individual calorie needs. This information is not intended to  replace advice given to you by your health care provider. Make sure you discuss any questions you have with your health care provider. Document Released: 05/05/2011 Document Revised: 05/09/2016 Document Reviewed: 05/09/2016 Elsevier Interactive Patient Education  2017 [employer].   IF you received an x-ray today, you will receive an invoice from [facility]. Please contact [facility] at [phone] with questions or concerns regarding your invoice.   IF you received labwork today, you will receive an invoice from [facility]. Please contact [facility] at [phone] with questions or concerns regarding your invoice.   Our billing staff will not be able to assist you with questions regarding bills from these companies.  You will be contacted with the lab results as soon as they are available. The fastest way to get your results is to activate your My Chart account. Instructions are located on the last page of this paperwork. If you have not heard from [place] regarding the results in 2 weeks, please contact this office.       I personally performed the services described in this documentation, which was scribed in my presence. The recorded information has been reviewed and considered for accuracy and completeness, addended by me as needed, and agree with information above.  Signed,   Meredith Staggers, MD Primary Care at [facility].  12/10/16 5:24 PM

## 2016-12-13 ENCOUNTER — Telehealth: Payer: Self-pay | Admitting: Physician Assistant

## 2016-12-13 LAB — COMPREHENSIVE METABOLIC PANEL
ALBUMIN: 4.4 g/dL (ref 3.5–5.5)
ALT: 14 IU/L (ref 0–44)
AST: 22 IU/L (ref 0–40)
Albumin/Globulin Ratio: 1.6 (ref 1.2–2.2)
Alkaline Phosphatase: 86 IU/L (ref 39–117)
BUN / CREAT RATIO: 11 (ref 9–20)
BUN: 11 mg/dL (ref 6–24)
Bilirubin Total: <0.2 mg/dL (ref 0.0–1.2)
CO2: 20 mmol/L (ref 20–29)
CREATININE: 1.03 mg/dL (ref 0.76–1.27)
Calcium: 9.8 mg/dL (ref 8.7–10.2)
Chloride: 105 mmol/L (ref 96–106)
GFR calc non Af Amer: 87 mL/min/{1.73_m2} (ref >59)
GFR, EST AFRICAN AMERICAN: 100 mL/min/{1.73_m2} (ref >59)
GLOBULIN, TOTAL: 2.7 g/dL (ref 1.5–4.5)
Glucose: 89 mg/dL (ref 65–99)
Potassium: 4.1 mmol/L (ref 3.5–5.2)
SODIUM: 143 mmol/L (ref 134–144)
TOTAL PROTEIN: 7.1 g/dL (ref 6.0–8.5)

## 2016-12-13 LAB — HEMOGLOBIN A1C
Est. average glucose Bld gHb Est-mCnc: 123 mg/dL
Hgb A1c MFr Bld: 5.9 % — ABNORMAL HIGH (ref 4.8–5.6)

## 2016-12-13 NOTE — Telephone Encounter (Signed)
PATIENT IS RETURNING OUR CALL REGARDING HIS LAB RESULTS THAT WAS ORDERED BY WHITNEY [facility]. HIS VOICE MAIL HAS NOT BEEN SET UP SO HE WILL WORK ON THAT TONIGHT SO THAT WHEN WE CALL HIM BACK WE CAN LEAVE HIM A MESSAGE BECAUSE HE WILL BE AT WORK. BEST PHONE [phone] (CELL) MBC

## 2016-12-14 LAB — GC/CHLAMYDIA PROBE AMP
Chlamydia trachomatis, NAA: NEGATIVE
Neisseria gonorrhoeae by PCR: NEGATIVE

## 2016-12-14 NOTE — Progress Notes (Signed)
Please call pt - he is negative for gonorrhea and chlamydia.  Thank you!

## 2016-12-14 NOTE — Telephone Encounter (Signed)
Spoke with patient and lab results given. dg

## 2016-12-15 ENCOUNTER — Encounter: Payer: Self-pay | Admitting: *Deleted

## 2016-12-16 ENCOUNTER — Other Ambulatory Visit: Payer: PRIVATE HEALTH INSURANCE | Admitting: Family Medicine

## 2017-02-20 ENCOUNTER — Telehealth: Payer: Self-pay | Admitting: Family Medicine

## 2017-02-20 NOTE — Telephone Encounter (Signed)
Pt needs a refill on his amlodipine.  He was seen for this last in July by Dr. Neva Seat. [phone]

## 2017-02-21 ENCOUNTER — Other Ambulatory Visit: Payer: Self-pay

## 2017-02-21 MED ORDER — AMLODIPINE BESYLATE 5 MG PO TABS: 5.0000 mg | ORAL_TABLET | Freq: Every day | ORAL | 0 refills | Status: DC

## 2017-02-21 NOTE — Telephone Encounter (Signed)
LVM to call back. According to the note in July, patient was suppose to come back for a f/u on BP. Once patient calls back will talk about refilling medication.

## 2017-02-21 NOTE — Telephone Encounter (Signed)
Patient called back and advised that a 30 day supply would be sent over to pharmacy. Patient was transferred to scheduling to make an appointment.

## 2017-03-28 ENCOUNTER — Ambulatory Visit (INDEPENDENT_AMBULATORY_CARE_PROVIDER_SITE_OTHER): Payer: PRIVATE HEALTH INSURANCE | Admitting: Family Medicine

## 2017-03-28 ENCOUNTER — Encounter: Payer: Self-pay | Admitting: Family Medicine

## 2017-03-28 VITALS — BP 138/82 | HR 71 | Temp 98.3°F | Resp 16 | Ht 73.62 in | Wt 221.0 lb

## 2017-03-28 DIAGNOSIS — Z7251 High risk heterosexual behavior: Secondary | ICD-10-CM | POA: Diagnosis not present

## 2017-03-28 DIAGNOSIS — R21 Rash and other nonspecific skin eruption: Secondary | ICD-10-CM

## 2017-03-28 DIAGNOSIS — Z720 Tobacco use: Secondary | ICD-10-CM

## 2017-03-28 DIAGNOSIS — I10 Essential (primary) hypertension: Secondary | ICD-10-CM | POA: Diagnosis not present

## 2017-03-28 DIAGNOSIS — R739 Hyperglycemia, unspecified: Secondary | ICD-10-CM | POA: Diagnosis not present

## 2017-03-28 MED ORDER — AMLODIPINE BESYLATE 5 MG PO TABS: 5.0000 mg | ORAL_TABLET | Freq: Every day | ORAL | 1 refills | Status: DC
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
## 2017-03-28 NOTE — Patient Instructions (Addendum)
Blood pressure is better. Ideally I would like to see it lower than 130/80.  Continue to cut back on salt in the diet - try the salt substitute at work as well as at home. Try to cut back to no more than 1-2 alcoholic drinks in a day as that can also affect your blood pressure.  Continue to cut back on smoking and vaping with use of chantix.   Blood sugar at prediabetes level - see information below on diet and this condition. Recheck levels in 3 months with other blood work.  I will check the herpes virus blood tests, and other STI tests, but with persistent patch/rash on penis, would like you to see dermatology.  We will refer you.  Prediabetes Eating Plan Prediabetes-also called impaired glucose tolerance or impaired fasting glucose-is a condition that causes blood sugar (blood glucose) levels to be higher than normal. Following a healthy diet can help to keep prediabetes under control. It can also help to lower the risk of type 2 diabetes and heart disease, which are increased in people who have prediabetes. Along with regular exercise, a healthy diet:  Promotes weight loss.  Helps to control blood sugar levels.  Helps to improve the way that the body uses insulin.  What do I need to know about this eating plan?  Use the glycemic index (GI) to plan your meals. The index tells you how quickly a food will raise your blood sugar. Choose low-GI foods. These foods take a longer time to raise blood sugar.  Pay close attention to the amount of carbohydrates in the food that you eat. Carbohydrates increase blood sugar levels.  Keep track of how many calories you take in. Eating the right amount of calories will help you to achieve a healthy weight. Losing about 7 percent of your starting weight can help to prevent type 2 diabetes.  You may want to follow a Mediterranean diet. This diet includes a lot of vegetables, lean meats or fish, whole grains, fruits, and healthy oils and fats. What  foods can I eat? Grains Whole grains, such as whole-wheat or whole-grain breads, crackers, cereals, and pasta. Unsweetened oatmeal. Bulgur. Barley. Quinoa. Brown rice. Corn or whole-wheat flour tortillas or taco shells. Vegetables Lettuce. Spinach. Peas. Beets. Cauliflower. Cabbage. Broccoli. Carrots. Tomatoes. Squash. Eggplant. Herbs. Peppers. Onions. Cucumbers. Brussels sprouts. Fruits Berries. Bananas. Apples. Oranges. Grapes. Papaya. Mango. Pomegranate. Kiwi. Grapefruit. Cherries. Meats and Other Protein Sources Seafood. Lean meats, such as chicken and [place] or lean cuts of pork and beef. Tofu. Eggs. Nuts. Beans. Dairy Low-fat or fat-free dairy products, such as yogurt, cottage cheese, and cheese. Beverages Water. Tea. Coffee. Sugar-free or diet soda. Seltzer water. Milk. Milk alternatives, such as soy or almond milk. Condiments Mustard. Relish. Low-fat, low-sugar ketchup. Low-fat, low-sugar barbecue sauce. Low-fat or fat-free mayonnaise. Sweets and Desserts Sugar-free or low-fat pudding. Sugar-free or low-fat ice cream and other frozen treats. Fats and Oils Avocado. Walnuts. Olive oil. The items listed above may not be a complete list of recommended foods or beverages. Contact your dietitian for more options. What foods are not recommended? Grains Refined white flour and flour products, such as bread, pasta, snack foods, and cereals. Beverages Sweetened drinks, such as sweet iced tea and soda. Sweets and Desserts Baked goods, such as cake, cupcakes, pastries, cookies, and cheesecake. The items listed above may not be a complete list of foods and beverages to avoid. Contact your dietitian for more information. This information is not intended to replace  advice given to you by your health care provider. Make sure you discuss any questions you have with your health care provider. Document Released: 09/30/2014 Document Revised: 10/22/2015 Document Reviewed: 06/11/2014 Elsevier  Interactive Patient Education  2017 [employer]  Prediabetes Prediabetes is the condition of having a blood sugar (blood glucose) level that is higher than it should be, but not high enough for you to be diagnosed with type 2 diabetes. Having prediabetes puts you at risk for developing type 2 diabetes (type 2 diabetes mellitus). Prediabetes may be called impaired glucose tolerance or impaired fasting glucose. Prediabetes usually does not cause symptoms. Your health care provider can diagnose this condition with blood tests. You may be tested for prediabetes if you are overweight and if you have at least one other risk factor for prediabetes. Risk factors for prediabetes include:  Having a family member with type 2 diabetes.  Being overweight or obese.  Being older than age 31.  Being of American-Indian, African-American, Hispanic/Latino, or Asian/Pacific Islander descent.  Having an inactive (sedentary) lifestyle.  Having a history of gestational diabetes or polycystic ovarian syndrome (PCOS).  Having low levels of good cholesterol (HDL-C) or high levels of blood fats (triglycerides).  Having high blood pressure.  What is blood glucose and how is blood glucose measured?  Blood glucose refers to the amount of glucose in your bloodstream. Glucose comes from eating foods that contain sugars and starches (carbohydrates) that the body breaks down into glucose. Your blood glucose level may be measured in mg/dL (milligrams per deciliter) or mmol/L (millimoles per liter).Your blood glucose may be checked with one or more of the following blood tests:  A fasting blood glucose (FBG) test. You will not be allowed to eat (you will fast) for at least 8 hours before a blood sample is taken. ? A normal range for FBG is 70-100 mg/dl (1.6-1.0 mmol/L).  An A1c (hemoglobin A1c) blood test. This test provides information about blood glucose control over the previous 2?3months.  An oral glucose  tolerance test (OGTT). This test measures your blood glucose twice: ? After fasting. This is your baseline level. ? Two hours after you drink a beverage that contains glucose.  You may be diagnosed with prediabetes:  If your FBG is 100?125 mg/dL (9.6-0.4 mmol/L).  If your A1c level is 5.7?6.4%.  If your OGGT result is 140?199 mg/dL (5.4-09 mmol/L).  These blood tests may be repeated to confirm your diagnosis. What happens if blood glucose is too high? The pancreas produces a hormone (insulin) that helps move glucose from the bloodstream into cells. When cells in the body do not respond properly to insulin that the body makes (insulin resistance), excess glucose builds up in the blood instead of going into cells. As a result, high blood glucose (hyperglycemia) can develop, which can cause many complications. This is a symptom of prediabetes. What can happen if blood glucose stays higher than normal for a long time? Having high blood glucose for a long time is dangerous. Too much glucose in your blood can damage your nerves and blood vessels. Long-term damage can lead to complications from diabetes, which may include:  Heart disease.  Stroke.  Blindness.  Kidney disease.  Depression.  Poor circulation in the feet and legs, which could lead to surgical removal (amputation) in severe cases.  How can prediabetes be prevented from turning into type 2 diabetes?  To help prevent type 2 diabetes, take the following actions:  Be physically active. ? Do moderate-intensity  physical activity for at least 30 minutes on at least 5 days of the week, or as much as told by your health care provider. This could be brisk walking, biking, or water aerobics. ? Ask your health care provider what activities are safe for you. A mix of physical activities may be best, such as walking, swimming, cycling, and strength training.  Lose weight as told by your health care provider. ? Losing 5-7% of your body  weight can reverse insulin resistance. ? Your health care provider can determine how much weight loss is best for you and can help you lose weight safely.  Follow a healthy meal plan. This includes eating lean proteins, complex carbohydrates, fresh fruits and vegetables, low-fat dairy products, and healthy fats. ? Follow instructions from your health care provider about eating or drinking restrictions. ? Make an appointment to see a diet and nutrition specialist (registered dietitian) to help you create a healthy eating plan that is right for you.  Do not smoke or use any tobacco products, such as cigarettes, chewing tobacco, and e-cigarettes. If you need help quitting, ask your health care provider.  Take over-the-counter and prescription medicines as told by your health care provider. You may be prescribed medicines that help lower the risk of type 2 diabetes.  This information is not intended to replace advice given to you by your health care provider. Make sure you discuss any questions you have with your health care provider. Document Released: 09/07/2015 Document Revised: 10/22/2015 Document Reviewed: 07/07/2015 Elsevier Interactive Patient Education  2018 [employer]     IF you received an x-ray today, you will receive an invoice from [facility]. Please contact [facility] at [phone] with questions or concerns regarding your invoice.   IF you received labwork today, you will receive an invoice from [facility]. Please contact [facility] at [phone] with questions or concerns regarding your invoice.   Our billing staff will not be able to assist you with questions regarding bills from these companies.  You will be contacted with the lab results as soon as they are available. The fastest way to get your results is to activate your My Chart account. Instructions are located on the last page of this paperwork. If you have not heard from us regarding the results in  2 weeks, please contact this office.

## 2017-03-29 LAB — BASIC METABOLIC PANEL
BUN/Creatinine Ratio: 13 (ref 9–20)
BUN: 15 mg/dL (ref 6–24)
CO2: 22 mmol/L (ref 20–29)
Calcium: 9.5 mg/dL (ref 8.7–10.2)
Chloride: 105 mmol/L (ref 96–106)
Creatinine, Ser: 1.2 mg/dL (ref 0.76–1.27)
GFR, EST AFRICAN AMERICAN: 83 mL/min/{1.73_m2} (ref >59)
GFR, EST NON AFRICAN AMERICAN: 72 mL/min/{1.73_m2} (ref >59)
Glucose: 96 mg/dL (ref 65–99)
POTASSIUM: 4.4 mmol/L (ref 3.5–5.2)
Sodium: 143 mmol/L (ref 134–144)

## 2017-03-29 LAB — HIV ANTIBODY (ROUTINE TESTING W REFLEX): HIV SCREEN 4TH GENERATION: NONREACTIVE

## 2017-03-29 LAB — RPR: RPR: NONREACTIVE

## 2017-03-29 LAB — HEMOGLOBIN A1C
Est. average glucose Bld gHb Est-mCnc: 123 mg/dL
HEMOGLOBIN A1C: 5.9 % — AB (ref 4.8–5.6)

## 2017-03-29 LAB — HSV(HERPES SIMPLEX VRS) I + II AB-IGG
HSV 1 Glycoprotein G Ab, IgG: 46.5 index — ABNORMAL HIGH (ref 0.00–0.90)
HSV 2 IgG, Type Spec: <0.91 index (ref 0.00–0.90)

## 2017-03-30 LAB — GC/CHLAMYDIA PROBE AMP
CHLAMYDIA, DNA PROBE: NEGATIVE
NEISSERIA GONORRHOEAE BY PCR: NEGATIVE

## 2017-05-15 ENCOUNTER — Ambulatory Visit: Payer: PRIVATE HEALTH INSURANCE | Admitting: Family Medicine

## 2017-06-06 ENCOUNTER — Other Ambulatory Visit: Payer: Self-pay | Admitting: Family Medicine

## 2017-06-06 DIAGNOSIS — K219 Gastro-esophageal reflux disease without esophagitis: Secondary | ICD-10-CM

## 2017-06-07 NOTE — Telephone Encounter (Signed)
I have not seen pt in over a year. Is instructed to f/u w/ PCP ~1/30 so will give pt 1 mo refill and he can discuss further w/ PCP Neva SeatGreene at next OV.

## 2017-07-11 ENCOUNTER — Other Ambulatory Visit: Payer: Self-pay

## 2017-07-11 ENCOUNTER — Ambulatory Visit: Payer: PRIVATE HEALTH INSURANCE | Admitting: Physician Assistant

## 2017-07-11 ENCOUNTER — Ambulatory Visit: Payer: Self-pay | Admitting: *Deleted

## 2017-07-11 DIAGNOSIS — I10 Essential (primary) hypertension: Secondary | ICD-10-CM

## 2017-07-11 MED ORDER — AMLODIPINE BESYLATE 5 MG PO TABS: 5.0000 mg | ORAL_TABLET | Freq: Every day | ORAL | 1 refills | Status: DC

## 2017-07-11 NOTE — Telephone Encounter (Signed)
Patient is calling to report that he had his blood pressure checked last night and it was 188/115. He reports he has been having headaches and some sluggishness with fatigue. Patient states he was started on BP medication- but did not follow up and so he has not continued it. Patient was given an appointment with Dr Chilton SiGreen on Thursday- but because he needs medication now- he was scheduled today for restart with C. Jeffery. Reason for Disposition . Systolic BP  >= 180 OR Diastolic >= 110  Answer Assessment - Initial Assessment Questions 1. BLOOD PRESSURE: "What is the blood pressure?" "Did you take at least two measurements 5 minutes apart?"     188/115 last night 2. ONSET: "When did you take your blood pressure?"     Last night 3. HOW: "How did you obtain the blood pressure?" (e.g., visiting nurse, automatic home BP monitor)     machine 4. HISTORY: "Do you have a history of high blood pressure?"     yes 5. MEDICATIONS: "Are you taking any medications for blood pressure?" "Have you missed any doses recently?"     No- patient did not follow up 6. OTHER SYMPTOMS: "Do you have any symptoms?" (e.g., headache, chest pain, blurred vision, difficulty breathing, weakness)     Headache, sluggish and fatigue 7. PREGNANCY: "Is there any chance you are pregnant?" "When was your last menstrual period?"     n/a  Protocols used: HIGH BLOOD PRESSURE-A-AH

## 2017-07-12 NOTE — Telephone Encounter (Signed)
Patient presented for his visit. I was asked if he really needed to be seen, as he has an appointment with Dr. Neva SeatGreene in 2 days. He has run out of his BP medication, but otherwise feels well.  Authorized a 30-day supply.

## 2017-07-13 ENCOUNTER — Encounter: Payer: Self-pay | Admitting: Family Medicine

## 2017-07-13 ENCOUNTER — Ambulatory Visit (INDEPENDENT_AMBULATORY_CARE_PROVIDER_SITE_OTHER): Payer: PRIVATE HEALTH INSURANCE | Admitting: Family Medicine

## 2017-07-13 ENCOUNTER — Telehealth: Payer: Self-pay | Admitting: Family Medicine

## 2017-07-13 VITALS — BP 152/100 | HR 70 | Temp 98.5°F | Resp 18 | Ht 73.0 in | Wt 228.0 lb

## 2017-07-13 DIAGNOSIS — R7303 Prediabetes: Secondary | ICD-10-CM | POA: Diagnosis not present

## 2017-07-13 DIAGNOSIS — I10 Essential (primary) hypertension: Secondary | ICD-10-CM | POA: Diagnosis not present

## 2017-07-13 DIAGNOSIS — Z1321 Encounter for screening for nutritional disorder: Secondary | ICD-10-CM | POA: Diagnosis not present

## 2017-07-13 DIAGNOSIS — Z79899 Other long term (current) drug therapy: Secondary | ICD-10-CM

## 2017-07-13 DIAGNOSIS — Z720 Tobacco use: Secondary | ICD-10-CM

## 2017-07-13 DIAGNOSIS — K219 Gastro-esophageal reflux disease without esophagitis: Secondary | ICD-10-CM | POA: Diagnosis not present

## 2017-07-13 MED ORDER — AMLODIPINE BESYLATE 5 MG PO TABS: 5.0000 mg | ORAL_TABLET | Freq: Every day | ORAL | 1 refills | Status: DC

## 2017-07-13 MED ORDER — VARENICLINE TARTRATE 1 MG PO TABS: 1.0000 mg | ORAL_TABLET | Freq: Every day | ORAL | 1 refills | Status: DC

## 2017-07-13 MED ORDER — OMEPRAZOLE 20 MG PO CPDR: 20.0000 mg | DELAYED_RELEASE_CAPSULE | Freq: Every day | ORAL | 0 refills | Status: DC

## 2017-07-13 MED ORDER — VARENICLINE TARTRATE 0.5 MG X 11 & 1 MG X 42 PO MISC: ORAL | 0 refills | Status: DC

## 2017-07-13 NOTE — Progress Notes (Signed)
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 usually do not last beyond 2-3 weeks. Changes or symptoms that you might experience include:  Mood swings.  Restlessness, anxiety, or irritation.  Difficulty concentrating.  Dizziness.  Strong cravings for sugary foods in addition to nicotine.  Mild weight gain.  Constipation.  Nausea.  Coughing or a sore throat.  Changes in how your medicines work in your body.  A depressed mood.  Difficulty sleeping (insomnia).  After the first 2-3 weeks of quitting, you may start to notice more positive results, such as:  Improved sense of smell and taste.  Decreased coughing and sore throat.  Slower heart rate.  Lower blood pressure.  Clearer skin.  The ability to breathe more easily.  Fewer sick days.  Quitting smoking is very challenging for most people. Do not get discouraged if you are not successful the first time. Some people need to make many attempts to quit before they achieve long-term success. Do your  best to stick to your quit plan, and talk with your health care provider if you have any questions or concerns. This information is not intended to replace advice given to you by your health care provider. Make sure you discuss any questions you have with your health care provider. Document Released: 05/10/2001 Document Revised: 01/12/2016 Document Reviewed: 09/30/2014 Elsevier Interactive Patient Education  2018 [employer].    Food Choices for Gastroesophageal Reflux Disease, Adult When you have gastroesophageal reflux disease (GERD), the foods you eat and your eating habits are very important. Choosing the right foods can help ease your discomfort. What guidelines do I need to follow?  Choose fruits, vegetables, whole grains, and low-fat dairy products.  Choose low-fat meat, fish, and poultry.  Limit fats such as oils, salad dressings, butter, nuts, and avocado.  Keep a food diary. This helps you identify foods that cause symptoms.  Avoid foods that cause symptoms. These may be different for everyone.  Eat small meals often instead of 3 large meals a day.  Eat your meals slowly, in a place where you are relaxed.  Limit fried foods.  Cook foods using methods other than frying.  Avoid drinking alcohol.  Avoid drinking large amounts of liquids with your meals.  Avoid bending over or lying down until 2-3 hours after eating. What foods are not recommended? These are some foods and drinks that may make your symptoms worse: Vegetables Tomatoes. Tomato juice. Tomato and spaghetti sauce. Chili peppers. Onion and garlic. Horseradish. Fruits Oranges, grapefruit, and lemon (fruit and juice). Meats High-fat meats, fish, and poultry. This includes hot dogs, ribs, ham, sausage, salami, and bacon. Dairy Whole milk and chocolate milk. Sour cream. Cream. Butter. Ice cream. Cream cheese. Drinks Coffee and tea. Bubbly (carbonated) drinks or energy drinks. Condiments Hot sauce.  Barbecue sauce. Sweets/Desserts Chocolate and cocoa. Donuts. Peppermint and spearmint. Fats and Oils High-fat foods. This includes [place] fries and potato chips. Other Vinegar. Strong spices. This includes black pepper, white pepper, red pepper, cayenne, curry powder, cloves, ginger, and chili powder. The items listed above may not be a complete list of foods and drinks  to avoid. Contact your dietitian for more information. This information is not intended to replace advice given to you by your health care provider. Make sure you discuss any questions you have with your health care provider. Document Released: 11/15/2011 Document Revised: 10/22/2015 Document Reviewed: 03/20/2013 Elsevier Interactive Patient Education  2017 [employer].    IF you received an x-ray today, you will receive an invoice from [facility]. Please contact [facility] at [phone] with questions or concerns regarding your invoice.   IF you received labwork today, you will receive an invoice from [facility]. Please contact [facility] at [phone] with questions or concerns regarding your invoice.   Our billing staff will not be able to assist you with questions regarding bills from these companies.  You will be contacted with the lab results as soon as they are available. The fastest way to get your results is to activate your My Chart account. Instructions are located on the last page of this paperwork. If you have not heard from [place] regarding the results in 2 weeks, please contact this office.      I personally performed the services described in this documentation, which was scribed in my presence. The recorded information has been reviewed and considered for accuracy and completeness, addended by me as needed, and agree with information above.  Signed,   Meredith Staggers, MD [facility].  07/15/17 10:18 PM

## 2017-07-13 NOTE — Telephone Encounter (Signed)
Spoke with [facility] and they stated they did not receive referral for pt back in November. They are currently scheduling into March/April. I have [facility] the [facility] since they schedule sooner. I left the pt a vm letting him know where we have sent him and their phone number.

## 2017-07-13 NOTE — Patient Instructions (Addendum)
You can try Chantix again for quitting smoking. See other information below.  1-800-quitnow.  [place] offers smoking cessation clinics. Registration is required. To register call [phone] or register online at [email or web address].  See foods to avoid for heartburn.  If you continue to need heartburn medicine daily in the next 6 weeks, follow up to decide if endoscopy or meeting with gastroenterologist.   Keep a record of your blood pressures outside of the office every day and if remaining over 140/90 in next 5 days, take 2 of the amlodipine (10mg  total). If still elevated on that dose, return to discuss changes.   Low intensity exercise most days per week to help with weight, blood pressure, and blood sugar.   Return to the clinic or go to the nearest emergency room if any of your symptoms worsen or new symptoms occur.  We will check on dermatology referral.    Prediabetes Prediabetes is the condition of having a blood sugar (blood glucose) level that is higher than it should be, but not high enough for you to be diagnosed with type 2 diabetes. Having prediabetes puts you at risk for developing type 2 diabetes (type 2 diabetes mellitus). Prediabetes may be called impaired glucose tolerance or impaired fasting glucose. Prediabetes usually does not cause symptoms. Your health care provider can diagnose this condition with blood tests. You may be tested for prediabetes if you are overweight and if you have at least one other risk factor for prediabetes. Risk factors for prediabetes include:  Having a family member with type 2 diabetes.  Being overweight or obese.  Being older than age 48.  Being of American-Indian, African-American, Hispanic/Latino, or Asian/Pacific Islander descent.  Having an inactive (sedentary) lifestyle.  Having a history of gestational diabetes or polycystic ovarian syndrome (PCOS).  Having low levels of good cholesterol (HDL-C) or high levels of blood fats  (triglycerides).  Having high blood pressure.  What is blood glucose and how is blood glucose measured?  Blood glucose refers to the amount of glucose in your bloodstream. Glucose comes from eating foods that contain sugars and starches (carbohydrates) that the body breaks down into glucose. Your blood glucose level may be measured in mg/dL (milligrams per deciliter) or mmol/L (millimoles per liter).Your blood glucose may be checked with one or more of the following blood tests:  A fasting blood glucose (FBG) test. You will not be allowed to eat (you will fast) for at least 8 hours before a blood sample is taken. ? A normal range for FBG is 70-100 mg/dl (1.3-0.83.9-5.6 mmol/L).  An A1c (hemoglobin A1c) blood test. This test provides information about blood glucose control over the previous 2?3months.  An oral glucose tolerance test (OGTT). This test measures your blood glucose twice: ? After fasting. This is your baseline level. ? Two hours after you drink a beverage that contains glucose.  You may be diagnosed with prediabetes:  If your FBG is 100?125 mg/dL (6.5-7.85.6-6.9 mmol/L).  If your A1c level is 5.7?6.4%.  If your OGGT result is 140?199 mg/dL (4.6-967.8-11 mmol/L).  These blood tests may be repeated to confirm your diagnosis. What happens if blood glucose is too high? The pancreas produces a hormone (insulin) that helps move glucose from the bloodstream into cells. When cells in the body do not respond properly to insulin that the body makes (insulin resistance), excess glucose builds up in the blood instead of going into cells. As a result, high blood glucose (hyperglycemia) can develop, which can cause many  complications. This is a symptom of prediabetes. What can happen if blood glucose stays higher than normal for a long time? Having high blood glucose for a long time is dangerous. Too much glucose in your blood can damage your nerves and blood vessels. Long-term damage can lead to  complications from diabetes, which may include:  Heart disease.  Stroke.  Blindness.  Kidney disease.  Depression.  Poor circulation in the feet and legs, which could lead to surgical removal (amputation) in severe cases.  How can prediabetes be prevented from turning into type 2 diabetes?  To help prevent type 2 diabetes, take the following actions:  Be physically active. ? Do moderate-intensity physical activity for at least 30 minutes on at least 5 days of the week, or as much as told by your health care provider. This could be brisk walking, biking, or water aerobics. ? Ask your health care provider what activities are safe for you. A mix of physical activities may be best, such as walking, swimming, cycling, and strength training.  Lose weight as told by your health care provider. ? Losing 5-7% of your body weight can reverse insulin resistance. ? Your health care provider can determine how much weight loss is best for you and can help you lose weight safely.  Follow a healthy meal plan. This includes eating lean proteins, complex carbohydrates, fresh fruits and vegetables, low-fat dairy products, and healthy fats. ? Follow instructions from your health care provider about eating or drinking restrictions. ? Make an appointment to see a diet and nutrition specialist (registered dietitian) to help you create a healthy eating plan that is right for you.  Do not smoke or use any tobacco products, such as cigarettes, chewing tobacco, and e-cigarettes. If you need help quitting, ask your health care provider.  Take over-the-counter and prescription medicines as told by your health care provider. You may be prescribed medicines that help lower the risk of type 2 diabetes.  This information is not intended to replace advice given to you by your health care provider. Make sure you discuss any questions you have with your health care provider. Document Released: 09/07/2015 Document  Revised: 10/22/2015 Document Reviewed: 07/07/2015 Elsevier Interactive Patient Education  2018 [employer].     Coping with Quitting Smoking Quitting smoking is a physical and mental challenge. You will face cravings, withdrawal symptoms, and temptation. Before quitting, work with your health care provider to make a plan that can help you cope. Preparation can help you quit and keep you from giving in. How can I cope with cravings? Cravings usually last for 5-10 minutes. If you get through it, the craving will pass. Consider taking the following actions to help you cope with cravings:  Keep your mouth busy: ? Chew sugar-free gum. ? Suck on hard candies or a straw. ? Brush your teeth.  Keep your hands and body busy: ? Immediately change to a different activity when you feel a craving. ? Squeeze or play with a ball. ? Do an activity or a hobby, like making bead jewelry, practicing needlepoint, or working with wood. ? Mix up your normal routine. ? Take a short exercise break. Go for a quick walk or run up and down stairs. ? Spend time in public places where smoking is not allowed.  Focus on doing something kind or helpful for someone else.  Call a friend or family member to talk during a craving.  Join a support group.  Call a quit line, such  as 1-800-QUIT-NOW.  Talk with your health care provider about medicines that might help you cope with cravings and make quitting easier for you.  How can I deal with withdrawal symptoms? Your body may experience negative effects as it tries to get used to not having nicotine in the system. These effects are called withdrawal symptoms. They may include:  Feeling hungrier than normal.  Trouble concentrating.  Irritability.  Trouble sleeping.  Feeling depressed.  Restlessness and agitation.  Craving a cigarette.  To manage withdrawal symptoms:  Avoid places, people, and activities that trigger your cravings.  Remember why you  want to quit.  Get plenty of sleep.  Avoid coffee and other caffeinated drinks. These may worsen some of your symptoms.  How can I handle social situations? Social situations can be difficult when you are quitting smoking, especially in the first few weeks. To manage this, you can:  Avoid parties, bars, and other social situations where people might be smoking.  Avoid alcohol.  Leave right away if you have the urge to smoke.  Explain to your family and friends that you are quitting smoking. Ask for understanding and support.  Plan activities with friends or family where smoking is not an option.  What are some ways I can cope with stress? Wanting to smoke may cause stress, and stress can make you want to smoke. Find ways to manage your stress. Relaxation techniques can help. For example:  Breathe slowly and deeply, in through your nose and out through your mouth.  Listen to soothing, relaxing music.  Talk with a family member or friend about your stress.  Light a candle.  Soak in a bath or take a shower.  Think about a peaceful place.  What are some ways I can prevent weight gain? Be aware that many people gain weight after they quit smoking. However, not everyone does. To keep from gaining weight, have a plan in place before you quit and stick to the plan after you quit. Your plan should include:  Having healthy snacks. When you have a craving, it may help to: ? Eat plain popcorn, crunchy carrots, celery, or other cut vegetables. ? Chew sugar-free gum.  Changing how you eat: ? Eat small portion sizes at meals. ? Eat 4-6 small meals throughout the day instead of 1-2 large meals a day. ? Be mindful when you eat. Do not watch television or do other things that might distract you as you eat.  Exercising regularly: ? Make time to exercise each day. If you do not have time for a long workout, do short bouts of exercise for 5-10 minutes several times a day. ? Do some form of  strengthening exercise, like weight lifting, and some form of aerobic exercise, like running or swimming.  Drinking plenty of water or other low-calorie or no-calorie drinks. Drink 6-8 glasses of water daily, or as much as instructed by your health care provider.  Summary  Quitting smoking is a physical and mental challenge. You will face cravings, withdrawal symptoms, and temptation to smoke again. Preparation can help you as you go through these challenges.  You can cope with cravings by keeping your mouth busy (such as by chewing gum), keeping your body and hands busy, and making calls to family, friends, or a helpline for people who want to quit smoking.  You can cope with withdrawal symptoms by avoiding places where people smoke, avoiding drinks with caffeine, and getting plenty of rest.  Ask your health care  provider about the different ways to prevent weight gain, avoid stress, and handle social situations. This information is not intended to replace advice given to you by your health care provider. Make sure you discuss any questions you have with your health care provider. Document Released: 05/13/2016 Document Revised: 05/13/2016 Document Reviewed: 05/13/2016 Elsevier Interactive Patient Education  2018 [employer].   Steps to Quit Smoking Smoking tobacco can be harmful to your health and can affect almost every organ in your body. Smoking puts you, and those around you, at risk for developing many serious chronic diseases. Quitting smoking is difficult, but it is one of the best things that you can do for your health. It is never too late to quit. What are the benefits of quitting smoking? When you quit smoking, you lower your risk of developing serious diseases and conditions, such as:  Lung cancer or lung disease, such as COPD.  Heart disease.  Stroke.  Heart attack.  Infertility.  Osteoporosis and bone fractures.  Additionally, symptoms such as coughing, wheezing,  and shortness of breath may get better when you quit. You may also find that you get sick less often because your body is stronger at fighting off colds and infections. If you are pregnant, quitting smoking can help to reduce your chances of having a baby of low birth weight. How do I get ready to quit? When you decide to quit smoking, create a plan to make sure that you are successful. Before you quit:  Pick a date to quit. Set a date within the next two weeks to give you time to prepare.  Write down the reasons why you are quitting. Keep this list in places where you will see it often, such as on your bathroom mirror or in your car or wallet.  Identify the people, places, things, and activities that make you want to smoke (triggers) and avoid them. Make sure to take these actions: ? Throw away all cigarettes at home, at work, and in your car. ? Throw away smoking accessories, such as Set designer. ? Clean your car and make sure to empty the ashtray. ? Clean your home, including curtains and carpets.  Tell your family, friends, and coworkers that you are quitting. Support from your loved ones can make quitting easier.  Talk with your health care provider about your options for quitting smoking.  Find out what treatment options are covered by your health insurance.  What strategies can I use to quit smoking? Talk with your healthcare provider about different strategies to quit smoking. Some strategies include:  Quitting smoking altogether instead of gradually lessening how much you smoke over a period of time. Research shows that quitting "cold [place]" is more successful than gradually quitting.  Attending in-person counseling to help you build problem-solving skills. You are more likely to have success in quitting if you attend several counseling sessions. Even short sessions of 10 minutes can be effective.  Finding resources and support systems that can help you to quit smoking  and remain smoke-free after you quit. These resources are most helpful when you use them often. They can include: ? Online chats with a Veterinary surgeon. ? Telephone quitlines. ? Automotive engineer. ? Support groups or group counseling. ? Text messaging programs. ? Mobile phone applications.  Taking medicines to help you quit smoking. (If you are pregnant or breastfeeding, talk with your health care provider first.) Some medicines contain nicotine and some do not. Both types of medicines help  with cravings, but the medicines that include nicotine help to relieve withdrawal symptoms. Your health care provider may recommend: ? Nicotine patches, gum, or lozenges. ? Nicotine inhalers or sprays. ? Non-nicotine medicine that is taken by mouth.  Talk with your health care provider about combining strategies, such as taking medicines while you are also receiving in-person counseling. Using these two strategies together makes you more likely to succeed in quitting than if you used either strategy on its own. If you are pregnant or breastfeeding, talk with your health care provider about finding counseling or other support strategies to quit smoking. Do not take medicine to help you quit smoking unless told to do so by your health care provider. What things can I do to make it easier to quit? Quitting smoking might feel overwhelming at first, but there is a lot that you can do to make it easier. Take these important actions:  Reach out to your family and friends and ask that they support and encourage you during this time. Call telephone quitlines, reach out to support groups, or work with a counselor for support.  Ask people who smoke to avoid smoking around you.  Avoid places that trigger you to smoke, such as bars, parties, or smoke-break areas at work.  Spend time around people who do not smoke.  Lessen stress in your life, because stress can be a smoking trigger for some people. To lessen  stress, try: ? Exercising regularly. ? Deep-breathing exercises. ? Yoga. ? Meditating. ? Performing a body scan. This involves closing your eyes, scanning your body from head to toe, and noticing which parts of your body are particularly tense. Purposefully relax the muscles in those areas.  Download or purchase mobile phone or tablet apps (applications) that can help you stick to your quit plan by providing reminders, tips, and encouragement. There are many free apps, such as QuitGuide from the [employer] developer for Disease Control and Prevention). You can find other support for quitting smoking (smoking cessation) through smokefree.gov and other websites.  How will I feel when I quit smoking? Within the first 24 hours of quitting smoking, you may start to feel some withdrawal symptoms. These symptoms are usually most noticeable 2-3 days after quitting, but they usually do not last beyond 2-3 weeks. Changes or symptoms that you might experience include:  Mood swings.  Restlessness, anxiety, or irritation.  Difficulty concentrating.  Dizziness.  Strong cravings for sugary foods in addition to nicotine.  Mild weight gain.  Constipation.  Nausea.  Coughing or a sore throat.  Changes in how your medicines work in your body.  A depressed mood.  Difficulty sleeping (insomnia).  After the first 2-3 weeks of quitting, you may start to notice more positive results, such as:  Improved sense of smell and taste.  Decreased coughing and sore throat.  Slower heart rate.  Lower blood pressure.  Clearer skin.  The ability to breathe more easily.  Fewer sick days.  Quitting smoking is very challenging for most people. Do not get discouraged if you are not successful the first time. Some people need to make many attempts to quit before they achieve long-term success. Do your best to stick to your quit plan, and talk with your health care provider if you have any questions or  concerns. This information is not intended to replace advice given to you by your health care provider. Make sure you discuss any questions you have with your health care provider. Document Released: 05/10/2001  Document Revised: 01/12/2016 Document Reviewed: 09/30/2014 Elsevier Interactive Patient Education  2018 [employer].    Food Choices for Gastroesophageal Reflux Disease, Adult When you have gastroesophageal reflux disease (GERD), the foods you eat and your eating habits are very important. Choosing the right foods can help ease your discomfort. What guidelines do I need to follow?  Choose fruits, vegetables, whole grains, and low-fat dairy products.  Choose low-fat meat, fish, and poultry.  Limit fats such as oils, salad dressings, butter, nuts, and avocado.  Keep a food diary. This helps you identify foods that cause symptoms.  Avoid foods that cause symptoms. These may be different for everyone.  Eat small meals often instead of 3 large meals a day.  Eat your meals slowly, in a place where you are relaxed.  Limit fried foods.  Cook foods using methods other than frying.  Avoid drinking alcohol.  Avoid drinking large amounts of liquids with your meals.  Avoid bending over or lying down until 2-3 hours after eating. What foods are not recommended? These are some foods and drinks that may make your symptoms worse: Vegetables Tomatoes. Tomato juice. Tomato and spaghetti sauce. Chili peppers. Onion and garlic. Horseradish. Fruits Oranges, grapefruit, and lemon (fruit and juice). Meats High-fat meats, fish, and poultry. This includes hot dogs, ribs, ham, sausage, salami, and bacon. Dairy Whole milk and chocolate milk. Sour cream. Cream. Butter. Ice cream. Cream cheese. Drinks Coffee and tea. Bubbly (carbonated) drinks or energy drinks. Condiments Hot sauce. Barbecue sauce. Sweets/Desserts Chocolate and cocoa. Donuts. Peppermint and spearmint. Fats and  Oils High-fat foods. This includes [place] fries and potato chips. Other Vinegar. Strong spices. This includes black pepper, white pepper, red pepper, cayenne, curry powder, cloves, ginger, and chili powder. The items listed above may not be a complete list of foods and drinks to avoid. Contact your dietitian for more information. This information is not intended to replace advice given to you by your health care provider. Make sure you discuss any questions you have with your health care provider. Document Released: 11/15/2011 Document Revised: 10/22/2015 Document Reviewed: 03/20/2013 Elsevier Interactive Patient Education  2017 [employer].    IF you received an x-ray today, you will receive an invoice from [facility]. Please contact [facility] at [phone] with questions or concerns regarding your invoice.   IF you received labwork today, you will receive an invoice from [facility]. Please contact [facility] at [phone] with questions or concerns regarding your invoice.   Our billing staff will not be able to assist you with questions regarding bills from these companies.  You will be contacted with the lab results as soon as they are available. The fastest way to get your results is to activate your My Chart account. Instructions are located on the last page of this paperwork. If you have not heard from [place] regarding the results in 2 weeks, please contact this office.

## 2017-07-14 LAB — FERRITIN: FERRITIN: 85 ng/mL (ref 30–400)

## 2017-07-14 LAB — VITAMIN B12: Vitamin B-12: 623 pg/mL (ref 232–1245)

## 2017-07-14 LAB — MAGNESIUM: MAGNESIUM: 2.6 mg/dL — AB (ref 1.6–2.3)

## 2017-07-14 LAB — VITAMIN D 25 HYDROXY (VIT D DEFICIENCY, FRACTURES): Vit D, 25-Hydroxy: 20.7 ng/mL — ABNORMAL LOW (ref 30.0–100.0)

## 2017-07-14 LAB — HEMOGLOBIN A1C
ESTIMATED AVERAGE GLUCOSE: 126 mg/dL
Hgb A1c MFr Bld: 6 % — ABNORMAL HIGH (ref 4.8–5.6)

## 2017-07-18 ENCOUNTER — Encounter: Payer: Self-pay | Admitting: *Deleted

## 2017-10-19 ENCOUNTER — Encounter: Payer: Self-pay | Admitting: Family Medicine

## 2017-11-17 ENCOUNTER — Other Ambulatory Visit: Payer: Self-pay | Admitting: Family Medicine

## 2017-11-17 DIAGNOSIS — K219 Gastro-esophageal reflux disease without esophagitis: Secondary | ICD-10-CM

## 2017-11-24 ENCOUNTER — Telehealth: Payer: Self-pay | Admitting: Family Medicine

## 2017-11-24 DIAGNOSIS — K219 Gastro-esophageal reflux disease without esophagitis: Secondary | ICD-10-CM

## 2017-11-24 NOTE — Telephone Encounter (Signed)
Copied from CRM [id number]. Topic: Quick Communication - Rx Refill/Question >> Nov 24, 2017 10:21 AM [name], Scott Galloway wrote: Medication: omeprazole (PRILOSEC) 20 MG capsule   Has the patient contacted their pharmacy? No   (Agent: If no, request that the patient contact the pharmacy for the refill.)  (Agent: If yes, when and what did the pharmacy advise?)  Preferred Pharmacy (with phone number or street name): [facility] [address] - [place] ([address]), [place] - [address] [phone] (Phone) [phone] (Fax)   Pt has an apt scheduled.       Agent: Please be advised that RX refills may take up to 3 business days. We ask that you follow-up with your pharmacy.

## 2017-11-25 MED ORDER — OMEPRAZOLE 20 MG PO CPDR: 20.0000 mg | DELAYED_RELEASE_CAPSULE | Freq: Every day | ORAL | 0 refills | Status: DC

## 2017-11-25 NOTE — Telephone Encounter (Signed)
OV scheduled for 12/14/17. 30 day refill given until seen in office for follow up visit

## 2017-12-14 ENCOUNTER — Ambulatory Visit: Payer: PRIVATE HEALTH INSURANCE | Admitting: Family Medicine

## 2017-12-26 ENCOUNTER — Other Ambulatory Visit: Payer: Self-pay

## 2017-12-26 ENCOUNTER — Encounter: Payer: Self-pay | Admitting: Family Medicine

## 2017-12-26 ENCOUNTER — Ambulatory Visit (INDEPENDENT_AMBULATORY_CARE_PROVIDER_SITE_OTHER): Payer: PRIVATE HEALTH INSURANCE | Admitting: Family Medicine

## 2017-12-26 VITALS — BP 138/96 | HR 69 | Temp 98.3°F | Ht 74.0 in | Wt 223.8 lb

## 2017-12-26 DIAGNOSIS — Z720 Tobacco use: Secondary | ICD-10-CM

## 2017-12-26 DIAGNOSIS — R7303 Prediabetes: Secondary | ICD-10-CM

## 2017-12-26 DIAGNOSIS — I10 Essential (primary) hypertension: Secondary | ICD-10-CM

## 2017-12-26 DIAGNOSIS — R0683 Snoring: Secondary | ICD-10-CM | POA: Diagnosis not present

## 2017-12-26 DIAGNOSIS — R7989 Other specified abnormal findings of blood chemistry: Secondary | ICD-10-CM | POA: Diagnosis not present

## 2017-12-26 DIAGNOSIS — R400 Somnolence: Secondary | ICD-10-CM

## 2017-12-26 DIAGNOSIS — K219 Gastro-esophageal reflux disease without esophagitis: Secondary | ICD-10-CM | POA: Diagnosis not present

## 2017-12-26 DIAGNOSIS — Z130 Encounter for screening for diseases of the blood and blood-forming organs and certain disorders involving the immune mechanism: Secondary | ICD-10-CM

## 2017-12-26 LAB — CBC
Hematocrit: 43.5 % (ref 37.5–51.0)
Hemoglobin: 13.9 g/dL (ref 13.0–17.7)
MCH: 28.8 pg (ref 26.6–33.0)
MCHC: 32 g/dL (ref 31.5–35.7)
MCV: 90 fL (ref 79–97)
PLATELETS: 340 10*3/uL (ref 150–450)
RBC: 4.83 x10E6/uL (ref 4.14–5.80)
RDW: 13.1 % (ref 12.3–15.4)
WBC: 5.8 10*3/uL (ref 3.4–10.8)

## 2017-12-26 LAB — HEMOGLOBIN A1C
Est. average glucose Bld gHb Est-mCnc: 123 mg/dL
Hgb A1c MFr Bld: 5.9 % — ABNORMAL HIGH (ref 4.8–5.6)

## 2017-12-26 LAB — BASIC METABOLIC PANEL
BUN/Creatinine Ratio: 10 (ref 9–20)
BUN: 11 mg/dL (ref 6–24)
CALCIUM: 9.4 mg/dL (ref 8.7–10.2)
CHLORIDE: 106 mmol/L (ref 96–106)
CO2: 23 mmol/L (ref 20–29)
Creatinine, Ser: 1.09 mg/dL (ref 0.76–1.27)
GFR calc non Af Amer: 80 mL/min/{1.73_m2} (ref >59)
GFR, EST AFRICAN AMERICAN: 93 mL/min/{1.73_m2} (ref >59)
Glucose: 105 mg/dL — ABNORMAL HIGH (ref 65–99)
POTASSIUM: 4.1 mmol/L (ref 3.5–5.2)
Sodium: 143 mmol/L (ref 134–144)

## 2017-12-26 MED ORDER — VARENICLINE TARTRATE 0.5 MG X 11 & 1 MG X 42 PO MISC: ORAL | 0 refills | Status: DC

## 2017-12-26 MED ORDER — AMLODIPINE BESYLATE 10 MG PO TABS: 10.0000 mg | ORAL_TABLET | Freq: Every day | ORAL | 1 refills | Status: DC

## 2017-12-26 MED ORDER — VARENICLINE TARTRATE 1 MG PO TABS: 1.0000 mg | ORAL_TABLET | Freq: Every day | ORAL | 1 refills | Status: DC

## 2017-12-26 MED ORDER — OMEPRAZOLE 20 MG PO CPDR: 20.0000 mg | DELAYED_RELEASE_CAPSULE | Freq: Every day | ORAL | 1 refills | Status: DC

## 2017-12-26 NOTE — Patient Instructions (Addendum)
Increase amlodipine to 10mg  per day for improved control of blood pressure.  It is very important for your health that you quit smoking. I have prescribed chantix again. Follow up in 6 weeks. to discuss smoking, and recheck blood pressure.  Start vitamin D supplement over the counter - 2000 units per day. We can recheck levels next visit.  Ok to continue omeprazole as needed. Quitting smoking will help with heartburn.  I will refer you for sleep study as sleep apnea can cause snoring and daytime fatigue. If any worsening fatigue, return to discuss further. Return to the clinic or go to the nearest emergency room if any of your symptoms worsen or new symptoms occur.    Food Choices for Gastroesophageal Reflux Disease, Adult When you have gastroesophageal reflux disease (GERD), the foods you eat and your eating habits are very important. Choosing the right foods can help ease your discomfort. What guidelines do I need to follow?  Choose fruits, vegetables, whole grains, and low-fat dairy products.  Choose low-fat meat, fish, and poultry.  Limit fats such as oils, salad dressings, butter, nuts, and avocado.  Keep a food diary. This helps you identify foods that cause symptoms.  Avoid foods that cause symptoms. These may be different for everyone.  Eat small meals often instead of 3 large meals a day.  Eat your meals slowly, in a place where you are relaxed.  Limit fried foods.  Cook foods using methods other than frying.  Avoid drinking alcohol.  Avoid drinking large amounts of liquids with your meals.  Avoid bending over or lying down until 2-3 hours after eating. What foods are not recommended? These are some foods and drinks that may make your symptoms worse: Vegetables Tomatoes. Tomato juice. Tomato and spaghetti sauce. Chili peppers. Onion and garlic. Horseradish. Fruits Oranges, grapefruit, and lemon (fruit and juice). Meats High-fat meats, fish, and poultry. This  includes hot dogs, ribs, ham, sausage, salami, and bacon. Dairy Whole milk and chocolate milk. Sour cream. Cream. Butter. Ice cream. Cream cheese. Drinks Coffee and tea. Bubbly (carbonated) drinks or energy drinks. Condiments Hot sauce. Barbecue sauce. Sweets/Desserts Chocolate and cocoa. Donuts. Peppermint and spearmint. Fats and Oils High-fat foods. This includes [place] fries and potato chips. Other Vinegar. Strong spices. This includes black pepper, white pepper, red pepper, cayenne, curry powder, cloves, ginger, and chili powder. The items listed above may not be a complete list of foods and drinks to avoid. Contact your dietitian for more information. This information is not intended to replace advice given to you by your health care provider. Make sure you discuss any questions you have with your health care provider. Document Released: 11/15/2011 Document Revised: 10/22/2015 Document Reviewed: 03/20/2013 Elsevier Interactive Patient Education  2017 [employer]    IF you received an x-ray today, you will receive an invoice from [facility] Please contact [facility] at [phone] with questions or concerns regarding your invoice.   IF you received labwork today, you will receive an invoice from [facility]. Please contact [facility] at [phone] with questions or concerns regarding your invoice.   Our billing staff will not be able to assist you with questions regarding bills from these companies.  You will be contacted with the lab results as soon as they are available. The fastest way to get your results is to activate your My Chart account. Instructions are located on the last page of this paperwork. If you have not heard from us regarding the results in 2 weeks, please  contact this office.
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
## 2018-01-05 ENCOUNTER — Telehealth: Payer: Self-pay | Admitting: Family Medicine

## 2018-02-05 ENCOUNTER — Ambulatory Visit: Payer: PRIVATE HEALTH INSURANCE | Admitting: Family Medicine

## 2018-03-22 NOTE — Telephone Encounter (Signed)
done

## 2018-07-05 DIAGNOSIS — Z6829 Body mass index (BMI) 29.0-29.9, adult: Secondary | ICD-10-CM | POA: Diagnosis not present

## 2018-07-05 DIAGNOSIS — I10 Essential (primary) hypertension: Secondary | ICD-10-CM | POA: Diagnosis not present

## 2018-07-05 DIAGNOSIS — M5440 Lumbago with sciatica, unspecified side: Secondary | ICD-10-CM | POA: Diagnosis not present

## 2018-07-21 ENCOUNTER — Other Ambulatory Visit: Payer: Self-pay | Admitting: Family Medicine

## 2018-07-21 DIAGNOSIS — I10 Essential (primary) hypertension: Secondary | ICD-10-CM

## 2018-07-23 NOTE — Telephone Encounter (Signed)
Courtesy refill of 21 tabs given to patient until next office visit 08/13/2018. Requested Prescriptions  Pending Prescriptions Disp Refills  . amLODipine (NORVASC) 10 MG tablet [Pharmacy Med Name: amLODIPine Besylate 10 MG Oral Tablet] 21 tablet 0    Sig: TAKE 1 TABLET BY MOUTH ONCE DAILY     Cardiovascular:  Calcium Channel Blockers Failed - 07/21/2018  3:47 PM      Failed - Last BP in normal range    BP Readings from Last 1 Encounters:  12/26/17 (!) 138/96         Failed - Valid encounter within last 6 months    Recent Outpatient Visits          6 months ago Essential hypertension   [facility] at [place], Asencion Partridge, MD   1 year ago Essential hypertension   [facility] at [facility], Asencion Partridge, MD   1 year ago Essential hypertension   [facility] at [facility], Asencion Partridge, MD   1 year ago Essential hypertension   [facility] at [facility], Asencion Partridge, MD   1 year ago Lesion of penis   Primary Care at [facility], Myrle Sheng, MD      Future Appointments            In 3 weeks Neva Seat Asencion Partridge, MD Primary Care at [place], [facility]

## 2018-07-23 NOTE — Telephone Encounter (Signed)
Requested medication (s) are due for refill today:  yes  Requested medication (s) are on the active medication list: yes  Last refill:  12/26/17 for 90 tabs and 1 refill  Future visit scheduled: yes  Notes to clinic:  Calcium channel blockers failed.  Requested Prescriptions  Pending Prescriptions Disp Refills   amLODipine (NORVASC) 10 MG tablet [Pharmacy Med Name: amLODIPine Besylate 10 MG Oral Tablet] 21 tablet 0    Sig: TAKE 1 TABLET BY MOUTH ONCE DAILY     Cardiovascular:  Calcium Channel Blockers Failed - 07/21/2018  3:47 PM      Failed - Last BP in normal range    BP Readings from Last 1 Encounters:  12/26/17 (!) 138/96         Failed - Valid encounter within last 6 months    Recent Outpatient Visits          6 months ago Essential hypertension   [facility] at [facility], Asencion Partridge, MD   1 year ago Essential hypertension   [facility] at [facility], Asencion Partridge, MD   1 year ago Essential hypertension   [facility] at [facility], Asencion Partridge, MD   1 year ago Essential hypertension   [facility] at [facility], Asencion Partridge, MD   1 year ago Lesion of penis   Primary Care at [facility], Myrle Sheng, MD      Future Appointments            In 3 weeks Neva Seat Asencion Partridge, MD Primary Care at [place], [facility]

## 2018-08-02 ENCOUNTER — Telehealth: Payer: Self-pay | Admitting: Family Medicine

## 2018-08-02 DIAGNOSIS — F17219 Nicotine dependence, cigarettes, with unspecified nicotine-induced disorders: Secondary | ICD-10-CM

## 2018-08-02 NOTE — Telephone Encounter (Signed)
Noted. Please advise patient of these alternatives. If he wants Rx bupropion, I can discuss with him further.

## 2018-08-02 NOTE — Telephone Encounter (Signed)
Patient was denied the medication Chantix by his insurance company. The only way that it is approved is if he has tried and failed the following: Nicotine replacement patches OTC, Nicotine gum OTC, Nicotine lozenge OTC, and bupropion.

## 2018-08-03 NOTE — Telephone Encounter (Signed)
Patient stated he will try the bupropion medication and you can go ahead and send this into the pharm

## 2018-08-04 MED ORDER — BUPROPION HCL ER (SMOKING DET) 150 MG PO TB12: 150.0000 mg | ORAL_TABLET | Freq: Two times a day (BID) | ORAL | 1 refills | Status: DC

## 2018-08-04 NOTE — Telephone Encounter (Signed)
Ok.  I sent in Rx for wellbutrin.  Can start once per day for 3 days, then twice per day. 2nd dose should be before 6pm. Can try to stop smoking after taking meds for first 7 days. If any new side effects, return right away to discuss. Let me know if there are questions.

## 2018-08-06 NOTE — Telephone Encounter (Signed)
Called and informed pt that Rx was sent into the pharmacy and advised about medication instructions. Advised also to call office back if he has any questions.

## 2018-08-13 ENCOUNTER — Ambulatory Visit: Payer: 59 | Admitting: Family Medicine

## 2018-08-13 ENCOUNTER — Encounter: Payer: Self-pay | Admitting: Family Medicine

## 2018-08-13 ENCOUNTER — Other Ambulatory Visit: Payer: Self-pay

## 2018-08-13 VITALS — BP 138/86 | HR 73 | Temp 98.2°F | Resp 16 | Ht 74.0 in | Wt 225.0 lb

## 2018-08-13 DIAGNOSIS — F17210 Nicotine dependence, cigarettes, uncomplicated: Secondary | ICD-10-CM | POA: Diagnosis not present

## 2018-08-13 DIAGNOSIS — I10 Essential (primary) hypertension: Secondary | ICD-10-CM | POA: Diagnosis not present

## 2018-08-13 DIAGNOSIS — R7303 Prediabetes: Secondary | ICD-10-CM | POA: Diagnosis not present

## 2018-08-13 DIAGNOSIS — K219 Gastro-esophageal reflux disease without esophagitis: Secondary | ICD-10-CM | POA: Diagnosis not present

## 2018-08-13 MED ORDER — OMEPRAZOLE 20 MG PO CPDR: 20.0000 mg | DELAYED_RELEASE_CAPSULE | Freq: Every day | ORAL | 1 refills | Status: DC

## 2018-08-13 MED ORDER — AMLODIPINE BESYLATE 10 MG PO TABS: 10.0000 mg | ORAL_TABLET | Freq: Every day | ORAL | 1 refills | Status: DC

## 2018-08-13 NOTE — Progress Notes (Signed)
Subjective:    Patient ID: Scott Galloway, male    DOB: [birth year], 48 y.o.   MRN: [id number]  HPI Scott Galloway is a 48 y.o. male Presents today for: Chief Complaint  Patient presents with  . Hypertension    need a refill on amlodipine  . Heartburn    need a refill on heart burn med- omeprazole   Tobacco abuse: chantix not covered. Started zyban 4 days ago. Ready to quit.  No new side effects with zyban.   GERD: Out of meds for a few weeks. Took daily prior. Controlled on meds. No hx PUD. No prior endoscopy. More heartburn this past weekend. Hist hemorrhoids. No n/v/abd pain.   Hypertension: BP Readings from Last 3 Encounters:  08/13/18 138/86  12/26/17 (!) 138/96  07/13/17 (!) 152/100   Lab Results  Component Value Date   CREATININE 1.09 12/26/2017  taking amlodpine 10mg  QD.   Lab Results  Component Value Date   CHOL 195 08/16/2013   HDL 44.00 08/16/2013   LDLCALC 140 (H) 08/16/2013   TRIG 56.0 08/16/2013   CHOLHDL 4 08/16/2013     Prediabetes: Lab Results  Component Value Date   HGBA1C 5.9 (H) 12/26/2017   Wt Readings from Last 3 Encounters:  08/13/18 225 lb (102.1 kg)  12/26/17 223 lb 12.8 oz (101.5 kg)  07/13/17 228 lb (103.4 kg)   Review of Systems Per HPI.     Objective:   Physical Exam Vitals signs reviewed.  Constitutional:      Appearance: He is well-developed.  HENT:     Head: Normocephalic and atraumatic.  Eyes:     Pupils: Pupils are equal, round, and reactive to light.  Neck:     Vascular: No carotid bruit or JVD.  Cardiovascular:     Rate and Rhythm: Normal rate and regular rhythm.     Heart sounds: Normal heart sounds. No murmur.  Pulmonary:     Effort: Pulmonary effort is normal.     Breath sounds: Normal breath sounds. No rales.  Skin:    General: Skin is warm and dry.  Neurological:     Mental Status: He is alert and oriented to person, place, and time.    Vitals:   08/13/18 1447  BP: 138/86  Pulse: 73  Resp: 16   Temp: 98.2 F (36.8 C)  TempSrc: Oral  SpO2: 98%  Weight: 225 lb (102.1 kg)  Height: 6\' 2"  (1.88 m)       Assessment & Plan:    TRAMINE HIGGS is a 48 y.o. male Gastroesophageal reflux disease without esophagitis - Plan: omeprazole (PRILOSEC) 20 MG capsule  -Restart PPI, avoidance of trigger foods, anticipate improvement with quitting smoking.  Recheck 2 months.  Consider GI eval if persistent need for PPI  Prediabetes - Plan: Hemoglobin A1c  -Watch diet, exercise.  Essential hypertension - Plan: Comprehensive metabolic panel, amLODipine (NORVASC) 10 MG tablet, Lipid panel  -  Stable, tolerating current regimen. Medications refilled. Labs pending as above.   Cigarette nicotine dependence without complication  -Tolerating Zyban.  Considering nicotine patch.  Handout given on coping with quitting smoking.  3-minute discussion on smoking cessation, previous attempts, medication treatment options, approach going forward.  Meds ordered this encounter  Medications  . amLODipine (NORVASC) 10 MG tablet    Sig: Take 1 tablet (10 mg total) by mouth daily.    Dispense:  90 tablet    Refill:  1  . omeprazole (PRILOSEC) 20 MG capsule  Sig: Take 1 capsule (20 mg total) by mouth daily.    Dispense:  90 capsule    Refill:  1    Please consider 90 day supplies to promote better adherence   Patient Instructions    1-800-quit now for help with quitting.  Continue buproprion for now.   See list of foods to avoid for heartburn. Restart meds daily, then space to every other day if tolerated. I suspect heartburn will improve when you quit smoking.  Please recheck in 2 months to discuss heartburn and smoking further.  Blood pressure is controlled today, no changes.  Watch diet, activity level to help with weight and blood sugar.  I will check that again today       Food Choices for Gastroesophageal Reflux Disease, Adult When you have gastroesophageal reflux disease (GERD), the  foods you eat and your eating habits are very important. Choosing the right foods can help ease your discomfort. Think about working with a nutrition specialist (dietitian) to help you make good choices. What are tips for following this plan?  Meals  Choose healthy foods that are low in fat, such as fruits, vegetables, whole grains, low-fat dairy products, and lean meat, fish, and poultry.  Eat small meals often instead of 3 large meals a day. Eat your meals slowly, and in a place where you are relaxed. Avoid bending over or lying down until 2-3 hours after eating.  Avoid eating meals 2-3 hours before bed.  Avoid drinking a lot of liquid with meals.  Cook foods using methods other than frying. Bake, grill, or broil food instead.  Avoid or limit: ? Chocolate. ? Peppermint or spearmint. ? Alcohol. ? Pepper. ? Black and decaffeinated coffee. ? Black and decaffeinated tea. ? Bubbly (carbonated) soft drinks. ? Caffeinated energy drinks and soft drinks.  Limit high-fat foods such as: ? Fatty meat or fried foods. ? Whole milk, cream, butter, or ice cream. ? Nuts and nut butters. ? Pastries, donuts, and sweets made with butter or shortening.  Avoid foods that cause symptoms. These foods may be different for everyone. Common foods that cause symptoms include: ? Tomatoes. ? Oranges, lemons, and limes. ? Peppers. ? Spicy food. ? Onions and garlic. ? Vinegar. Lifestyle  Maintain a healthy weight. Ask your doctor what weight is healthy for you. If you need to lose weight, work with your doctor to do so safely.  Exercise for at least 30 minutes for 5 or more days each week, or as told by your doctor.  Wear loose-fitting clothes.  Do not smoke. If you need help quitting, ask your doctor.  Sleep with the head of your bed higher than your feet. Use a wedge under the mattress or blocks under the bed frame to raise the head of the bed. Summary  When you have gastroesophageal reflux  disease (GERD), food and lifestyle choices are very important in easing your symptoms.  Eat small meals often instead of 3 large meals a day. Eat your meals slowly, and in a place where you are relaxed.  Limit high-fat foods such as fatty meat or fried foods.  Avoid bending over or lying down until 2-3 hours after eating.  Avoid peppermint and spearmint, caffeine, alcohol, and chocolate. This information is not intended to replace advice given to you by your health care provider. Make sure you discuss any questions you have with your health care provider. Document Released: 11/15/2011 Document Revised: 06/21/2016 Document Reviewed: 06/21/2016 Elsevier Interactive Patient Education  2019 [employer] Inc[employer]   Coping with Quitting Smoking  Quitting smoking is a physical and mental challenge. You will face cravings, withdrawal symptoms, and temptation. Before quitting, work with your health care provider to make a plan that can help you cope. Preparation can help you quit and keep you from giving in. How can I cope with cravings? Cravings usually last for 5-10 minutes. If you get through it, the craving will pass. Consider taking the following actions to help you cope with cravings:  Keep your mouth busy: ? Chew sugar-free gum. ? Suck on hard candies or a straw. ? Brush your teeth.  Keep your hands and body busy: ? Immediately change to a different activity when you feel a craving. ? Squeeze or play with a ball. ? Do an activity or a hobby, like making bead jewelry, practicing needlepoint, or working with wood. ? Mix up your normal routine. ? Take a short exercise break. Go for a quick walk or run up and down stairs. ? Spend time in public places where smoking is not allowed.  Focus on doing something kind or helpful for someone else.  Call a friend or family member to talk during a craving.  Join a support group.  Call a quit line, such as 1-800-QUIT-NOW.  Talk with your health care  provider about medicines that might help you cope with cravings and make quitting easier for you. How can I deal with withdrawal symptoms? Your body may experience negative effects as it tries to get used to not having nicotine in the system. These effects are called withdrawal symptoms. They may include:  Feeling hungrier than normal.  Trouble concentrating.  Irritability.  Trouble sleeping.  Feeling depressed.  Restlessness and agitation.  Craving a cigarette. To manage withdrawal symptoms:  Avoid places, people, and activities that trigger your cravings.  Remember why you want to quit.  Get plenty of sleep.  Avoid coffee and other caffeinated drinks. These may worsen some of your symptoms. How can I handle social situations? Social situations can be difficult when you are quitting smoking, especially in the first few weeks. To manage this, you can:  Avoid parties, bars, and other social situations where people might be smoking.  Avoid alcohol.  Leave right away if you have the urge to smoke.  Explain to your family and friends that you are quitting smoking. Ask for understanding and support.  Plan activities with friends or family where smoking is not an option. What are some ways I can cope with stress? Wanting to smoke may cause stress, and stress can make you want to smoke. Find ways to manage your stress. Relaxation techniques can help. For example:  Breathe slowly and deeply, in through your nose and out through your mouth.  Listen to soothing, relaxing music.  Talk with a family member or friend about your stress.  Light a candle.  Soak in a bath or take a shower.  Think about a peaceful place. What are some ways I can prevent weight gain? Be aware that many people gain weight after they quit smoking. However, not everyone does. To keep from gaining weight, have a plan in place before you quit and stick to the plan after you quit. Your plan should  include:  Having healthy snacks. When you have a craving, it may help to: ? Eat plain popcorn, crunchy carrots, celery, or other cut vegetables. ? Chew sugar-free gum.  Changing how you eat: ? Eat small portion sizes at  meals. ? Eat 4-6 small meals throughout the day instead of 1-2 large meals a day. ? Be mindful when you eat. Do not watch television or do other things that might distract you as you eat.  Exercising regularly: ? Make time to exercise each day. If you do not have time for a long workout, do short bouts of exercise for 5-10 minutes several times a day. ? Do some form of strengthening exercise, like weight lifting, and some form of aerobic exercise, like running or swimming.  Drinking plenty of water or other low-calorie or no-calorie drinks. Drink 6-8 glasses of water daily, or as much as instructed by your health care provider. Summary  Quitting smoking is a physical and mental challenge. You will face cravings, withdrawal symptoms, and temptation to smoke again. Preparation can help you as you go through these challenges.  You can cope with cravings by keeping your mouth busy (such as by chewing gum), keeping your body and hands busy, and making calls to family, friends, or a helpline for people who want to quit smoking.  You can cope with withdrawal symptoms by avoiding places where people smoke, avoiding drinks with caffeine, and getting plenty of rest.  Ask your health care provider about the different ways to prevent weight gain, avoid stress, and handle social situations. This information is not intended to replace advice given to you by your health care provider. Make sure you discuss any questions you have with your health care provider. Document Released: 05/13/2016 Document Revised: 05/13/2016 Document Reviewed: 05/13/2016 Elsevier Interactive Patient Education  [employer].   If you have lab work done today you will be contacted with your lab results  within the next 2 weeks.  If you have not heard from [place] then please contact us. The fastest way to get your results is to register for My Chart.   IF you received an x-ray today, you will receive an invoice from [facility]. Please contact [facility] at [phone] with questions or concerns regarding your invoice.   IF you received labwork today, you will receive an invoice from [place]. Please contact [facility] at [phone] with questions or concerns regarding your invoice.   Our billing staff will not be able to assist you with questions regarding bills from these companies.  You will be contacted with the lab results as soon as they are available. The fastest way to get your results is to activate your My Chart account. Instructions are located on the last page of this paperwork. If you have not heard from [place] regarding the results in 2 weeks, please contact this office.       Signed,   Meredith Staggers, MD Primary [facility] at [facility].  08/15/18 9:13 PM

## 2018-08-13 NOTE — Patient Instructions (Addendum)
1-800-quit now for help with quitting.  Continue buproprion for now.   See list of foods to avoid for heartburn. Restart meds daily, then space to every other day if tolerated. I suspect heartburn will improve when you quit smoking.  Please recheck in 2 months to discuss heartburn and smoking further.  Blood pressure is controlled today, no changes.  Watch diet, activity level to help with weight and blood sugar.  I will check that again today       Food Choices for Gastroesophageal Reflux Disease, Adult When you have gastroesophageal reflux disease (GERD), the foods you eat and your eating habits are very important. Choosing the right foods can help ease your discomfort. Think about working with a nutrition specialist (dietitian) to help you make good choices. What are tips for following this plan?  Meals  Choose healthy foods that are low in fat, such as fruits, vegetables, whole grains, low-fat dairy products, and lean meat, fish, and poultry.  Eat small meals often instead of 3 large meals a day. Eat your meals slowly, and in a place where you are relaxed. Avoid bending over or lying down until 2-3 hours after eating.  Avoid eating meals 2-3 hours before bed.  Avoid drinking a lot of liquid with meals.  Cook foods using methods other than frying. Bake, grill, or broil food instead.  Avoid or limit: ? Chocolate. ? Peppermint or spearmint. ? Alcohol. ? Pepper. ? Black and decaffeinated coffee. ? Black and decaffeinated tea. ? Bubbly (carbonated) soft drinks. ? Caffeinated energy drinks and soft drinks.  Limit high-fat foods such as: ? Fatty meat or fried foods. ? Whole milk, cream, butter, or ice cream. ? Nuts and nut butters. ? Pastries, donuts, and sweets made with butter or shortening.  Avoid foods that cause symptoms. These foods may be different for everyone. Common foods that cause symptoms include: ? Tomatoes. ? Oranges, lemons, and  limes. ? Peppers. ? Spicy food. ? Onions and garlic. ? Vinegar. Lifestyle  Maintain a healthy weight. Ask your doctor what weight is healthy for you. If you need to lose weight, work with your doctor to do so safely.  Exercise for at least 30 minutes for 5 or more days each week, or as told by your doctor.  Wear loose-fitting clothes.  Do not smoke. If you need help quitting, ask your doctor.  Sleep with the head of your bed higher than your feet. Use a wedge under the mattress or blocks under the bed frame to raise the head of the bed. Summary  When you have gastroesophageal reflux disease (GERD), food and lifestyle choices are very important in easing your symptoms.  Eat small meals often instead of 3 large meals a day. Eat your meals slowly, and in a place where you are relaxed.  Limit high-fat foods such as fatty meat or fried foods.  Avoid bending over or lying down until 2-3 hours after eating.  Avoid peppermint and spearmint, caffeine, alcohol, and chocolate. This information is not intended to replace advice given to you by your health care provider. Make sure you discuss any questions you have with your health care provider. Document Released: 11/15/2011 Document Revised: 06/21/2016 Document Reviewed: 06/21/2016 Elsevier Interactive Patient Education  2019 [employer].   Coping with Quitting Smoking  Quitting smoking is a physical and mental challenge. You will face cravings, withdrawal symptoms, and temptation. Before quitting, work with your health care provider to make a plan that can help you cope.  Preparation can help you quit and keep you from giving in. How can I cope with cravings? Cravings usually last for 5-10 minutes. If you get through it, the craving will pass. Consider taking the following actions to help you cope with cravings:  Keep your mouth busy: ? Chew sugar-free gum. ? Suck on hard candies or a straw. ? Brush your teeth.  Keep your hands and  body busy: ? Immediately change to a different activity when you feel a craving. ? Squeeze or play with a ball. ? Do an activity or a hobby, like making bead jewelry, practicing needlepoint, or working with wood. ? Mix up your normal routine. ? Take a short exercise break. Go for a quick walk or run up and down stairs. ? Spend time in public places where smoking is not allowed.  Focus on doing something kind or helpful for someone else.  Call a friend or family member to talk during a craving.  Join a support group.  Call a quit line, such as 1-800-QUIT-NOW.  Talk with your health care provider about medicines that might help you cope with cravings and make quitting easier for you. How can I deal with withdrawal symptoms? Your body may experience negative effects as it tries to get used to not having nicotine in the system. These effects are called withdrawal symptoms. They may include:  Feeling hungrier than normal.  Trouble concentrating.  Irritability.  Trouble sleeping.  Feeling depressed.  Restlessness and agitation.  Craving a cigarette. To manage withdrawal symptoms:  Avoid places, people, and activities that trigger your cravings.  Remember why you want to quit.  Get plenty of sleep.  Avoid coffee and other caffeinated drinks. These may worsen some of your symptoms. How can I handle social situations? Social situations can be difficult when you are quitting smoking, especially in the first few weeks. To manage this, you can:  Avoid parties, bars, and other social situations where people might be smoking.  Avoid alcohol.  Leave right away if you have the urge to smoke.  Explain to your family and friends that you are quitting smoking. Ask for understanding and support.  Plan activities with friends or family where smoking is not an option. What are some ways I can cope with stress? Wanting to smoke may cause stress, and stress can make you want to smoke.  Find ways to manage your stress. Relaxation techniques can help. For example:  Breathe slowly and deeply, in through your nose and out through your mouth.  Listen to soothing, relaxing music.  Talk with a family member or friend about your stress.  Light a candle.  Soak in a bath or take a shower.  Think about a peaceful place. What are some ways I can prevent weight gain? Be aware that many people gain weight after they quit smoking. However, not everyone does. To keep from gaining weight, have a plan in place before you quit and stick to the plan after you quit. Your plan should include:  Having healthy snacks. When you have a craving, it may help to: ? Eat plain popcorn, crunchy carrots, celery, or other cut vegetables. ? Chew sugar-free gum.  Changing how you eat: ? Eat small portion sizes at meals. ? Eat 4-6 small meals throughout the day instead of 1-2 large meals a day. ? Be mindful when you eat. Do not watch television or do other things that might distract you as you eat.  Exercising regularly: ? Make time  to exercise each day. If you do not have time for a long workout, do short bouts of exercise for 5-10 minutes several times a day. ? Do some form of strengthening exercise, like weight lifting, and some form of aerobic exercise, like running or swimming.  Drinking plenty of water or other low-calorie or no-calorie drinks. Drink 6-8 glasses of water daily, or as much as instructed by your health care provider. Summary  Quitting smoking is a physical and mental challenge. You will face cravings, withdrawal symptoms, and temptation to smoke again. Preparation can help you as you go through these challenges.  You can cope with cravings by keeping your mouth busy (such as by chewing gum), keeping your body and hands busy, and making calls to family, friends, or a helpline for people who want to quit smoking.  You can cope with withdrawal symptoms by avoiding places where  people smoke, avoiding drinks with caffeine, and getting plenty of rest.  Ask your health care provider about the different ways to prevent weight gain, avoid stress, and handle social situations. This information is not intended to replace advice given to you by your health care provider. Make sure you discuss any questions you have with your health care provider. Document Released: 05/13/2016 Document Revised: 05/13/2016 Document Reviewed: 05/13/2016 Elsevier Interactive Patient Education  [employer].   If you have lab work done today you will be contacted with your lab results within the next 2 weeks.  If you have not heard from [place] then please contact us. The fastest way to get your results is to register for My Chart.   IF you received an x-ray today, you will receive an invoice from [facility]. Please contact [facility] at [phone] with questions or concerns regarding your invoice.   IF you received labwork today, you will receive an invoice from [place]. Please contact [facility] at [phone] with questions or concerns regarding your invoice.   Our billing staff will not be able to assist you with questions regarding bills from these companies.  You will be contacted with the lab results as soon as they are available. The fastest way to get your results is to activate your My Chart account. Instructions are located on the last page of this paperwork. If you have not heard from [place] regarding the results in 2 weeks, please contact this office.

## 2018-08-14 LAB — COMPREHENSIVE METABOLIC PANEL
ALT: 14 IU/L (ref 0–44)
AST: 18 IU/L (ref 0–40)
Albumin/Globulin Ratio: 1.8 (ref 1.2–2.2)
Albumin: 4.5 g/dL (ref 4.0–5.0)
Alkaline Phosphatase: 78 IU/L (ref 39–117)
BUN/Creatinine Ratio: 7 — ABNORMAL LOW (ref 9–20)
BUN: 8 mg/dL (ref 6–24)
Bilirubin Total: <0.2 mg/dL (ref 0.0–1.2)
CALCIUM: 9.2 mg/dL (ref 8.7–10.2)
CO2: 23 mmol/L (ref 20–29)
Chloride: 104 mmol/L (ref 96–106)
Creatinine, Ser: 1.09 mg/dL (ref 0.76–1.27)
GFR calc Af Amer: 92 mL/min/{1.73_m2} (ref >59)
GFR calc non Af Amer: 80 mL/min/{1.73_m2} (ref >59)
Globulin, Total: 2.5 g/dL (ref 1.5–4.5)
Glucose: 85 mg/dL (ref 65–99)
Potassium: 4.5 mmol/L (ref 3.5–5.2)
SODIUM: 142 mmol/L (ref 134–144)
Total Protein: 7 g/dL (ref 6.0–8.5)

## 2018-08-14 LAB — HEMOGLOBIN A1C
Est. average glucose Bld gHb Est-mCnc: 120 mg/dL
Hgb A1c MFr Bld: 5.8 % — ABNORMAL HIGH (ref 4.8–5.6)

## 2018-08-14 LAB — LIPID PANEL
Chol/HDL Ratio: 5.5 ratio — ABNORMAL HIGH (ref 0.0–5.0)
Cholesterol, Total: 214 mg/dL — ABNORMAL HIGH (ref 100–199)
HDL: 39 mg/dL — AB (ref >39)
LDL Calculated: 151 mg/dL — ABNORMAL HIGH (ref 0–99)
Triglycerides: 119 mg/dL (ref 0–149)
VLDL Cholesterol Cal: 24 mg/dL (ref 5–40)

## 2018-09-26 ENCOUNTER — Telehealth: Payer: Self-pay | Admitting: Family Medicine

## 2018-09-26 NOTE — Telephone Encounter (Signed)
Pt called wanted to know if he can have his Wellbutrin changed to chantix , because he feels like it not working.please advise.

## 2018-09-27 NOTE — Telephone Encounter (Signed)
Please change if possible.

## 2018-09-27 NOTE — Telephone Encounter (Signed)
Please review prior telephone notes 3/5 regarding chantix.    We initially prescribed this, but it was not covered by insurance.  Is it covered now? Can check into out of pocket cost if not and then if he would still like it I can then send it in.

## 2018-09-27 NOTE — Telephone Encounter (Signed)
Pt called back in to follow up. Pt says that he spoke with his insurance company and was advised that Chantix is a tier 4 medication  that it is covered at 0 co-pay.    Please assist.

## 2018-09-27 NOTE — Telephone Encounter (Signed)
Spoke with patient and he states his co workers get if for free. Told him to call his insurance and see what they say. He stated he will call us back with information.

## 2018-09-29 MED ORDER — VARENICLINE TARTRATE 1 MG PO TABS: 1.0000 mg | ORAL_TABLET | Freq: Two times a day (BID) | ORAL | 2 refills | Status: DC

## 2018-09-29 MED ORDER — VARENICLINE TARTRATE 0.5 MG X 11 & 1 MG X 42 PO MISC: ORAL | 0 refills | Status: DC

## 2018-09-29 NOTE — Addendum Note (Signed)
Addended by: Meredith Staggers R on: 09/29/2018 10:43 AM   Modules accepted: Orders

## 2018-09-29 NOTE — Telephone Encounter (Signed)
Printed rx- can sign Tuesday to fax to pharmacy. Can advise pt that Chantix will be sent in.

## 2018-10-02 ENCOUNTER — Telehealth: Payer: Self-pay | Admitting: Family Medicine

## 2018-10-02 NOTE — Telephone Encounter (Signed)
Copied from CRM [id number]. Topic: Quick Communication - Rx Refill/Question >> Oct 02, 2018  4:01 PM Lynne Logan D wrote: Medication: varenicline (CHANTIX CONTINUING MONTH PAK) 1 MG tablet / varenicline (CHANTIX STARTING MONTH PAK) 0.5 MG X 11 & 1 MG X 42 tablet / Pt stated he went to the pharmacy and they told him they had not received rx's that were faxed over on 09/29/18. Requesting they be resent to pharmacy and let the pt know so he may go back and pick them up.  Has the patient contacted their pharmacy? Yes.   (Agent: If no, request that the patient contact the pharmacy for the refill.) (Agent: If yes, when and what did the pharmacy advise?)  Preferred Pharmacy (with phone number or street name): [facility] [address] ([address] [place] - [address] [phone] (Phone) [phone] (Fax)    Agent: Please be advised that RX refills may take up to 3 business days. We ask that you follow-up with your pharmacy.

## 2018-10-03 ENCOUNTER — Other Ambulatory Visit: Payer: Self-pay | Admitting: Emergency Medicine

## 2018-10-03 NOTE — Telephone Encounter (Signed)
Spoke to the pharm and they will get the starter pack ready for patient

## 2018-10-04 DIAGNOSIS — M48061 Spinal stenosis, lumbar region without neurogenic claudication: Secondary | ICD-10-CM | POA: Diagnosis not present

## 2018-10-04 DIAGNOSIS — M4726 Other spondylosis with radiculopathy, lumbar region: Secondary | ICD-10-CM | POA: Diagnosis not present

## 2018-10-09 NOTE — Telephone Encounter (Signed)
Pt called in and stated that this med is going to need a PA completed on it.  Please advise   Pharmacy - [facility] [address] ([address]),  - [address] [phone] (Phone)

## 2018-10-09 NOTE — Telephone Encounter (Signed)
Pt called [facility] [address] and his insurance. He said they have not received prior auth for Chantix. Ins does not have it documented as being submitted. Pt has been waiting since 5/2. Pt requesting call with status update.   Call back [phone]

## 2018-10-10 NOTE — Telephone Encounter (Signed)
Patient is calling to check status regarding his PA for Chantix.  Patient is stating pharmacy does have the meds, they are just waiting on PA.   The medication is at  [facility] [address] ([place]), [place] -[phone] [phone]  Patient call back #[phone]

## 2018-10-10 NOTE — Telephone Encounter (Signed)
I talked with pt. I stated that message will be sent to our PA team. Pt states understand that this medication was not known that a PA was needed  until yesterday .

## 2018-10-15 NOTE — Telephone Encounter (Signed)
Medication was denied by the insurance company. In order for them to approve the medicine is for the patient to try and fail Nicotine replacement patches OTC, Nicotine gum OTC, Nicotine lozenge OTC

## 2018-10-19 NOTE — Telephone Encounter (Signed)
Pt advised that Chantix was not approved.  Provided info on what pt is expected to try OTC.  Pt upset, stating his coworkers get Chantix for free and wanted to know why.  Advised am unable to explain and that we can only go by what insurance is telling us. Pt disconnected call.

## 2018-10-25 NOTE — Telephone Encounter (Signed)
Attempted to call pt and inform him that Not sure if this will help get Chantix covered. I have left a VM to call back if he wants any additional information.

## 2018-10-25 NOTE — Telephone Encounter (Signed)
He had taken Chantix in the past, and then in March was taking Zyban.  4/29 - did not feel like Zyban working. Reported on 4/30 that Chantix was tier 4, and 0 copay apparently.  He has tried patches in past that reported not working. Not sure if this will help get Chantix covered. Thanks.

## 2018-11-21 ENCOUNTER — Ambulatory Visit: Payer: Self-pay | Admitting: Family Medicine

## 2018-11-21 NOTE — Telephone Encounter (Signed)
Pt called regarding a sore throat that developed on last night. He thinks it is because he has cut some trees down in his back yard , was sweating profusely, took a shower and went to bed and then developed this sore throat. Bu he is also c/o having fatigue which he thinks it from getting up early 5 am and going to bed at 10. He is requesting an appointment. Advised of having a virtual appointment. Also advised of continuing to drink fluids, some warm liquids, honey , lemon and to gargle with warm salt water. He voiced understanding and had to disconnect because he had a ride waiting on him. He is requesting the practice to give him a call if any appointment is available today or tomorrow. He has a walk in appointment scheduled for Monday. [facility] [place] notified of pt's request. Routing to the office for review.  Reason for Disposition . SEVERE (e.g., excruciating) throat pain  Answer Assessment - Initial Assessment Questions 1. ONSET: "When did the throat start hurting?" (Hours or days ago)      Last throat 2. SEVERITY: "How bad is the sore throat?" (Scale 1-10; mild, moderate or severe)   - MILD (1-3):  doesn't interfere with eating or normal activities   - MODERATE (4-7): interferes with eating some solids and normal activities   - SEVERE (8-10):  excruciating pain, interferes with most normal activities   - SEVERE DYSPHAGIA: can't swallow liquids, drooling     severe 3. STREP EXPOSURE: "Has there been any exposure to strep within the past week?" If so, ask: "What type of contact occurred?"      no 4.  VIRAL SYMPTOMS: "Are there any symptoms of a cold, such as a runny nose, cough, hoarse voice or red eyes?"      Red eyes 5. FEVER: "Do you have a fever?" If so, ask: "What is your temperature, how was it measured, and when did it start?"     Not checked temp 6. PUS ON THE TONSILS: "Is there pus on the tonsils in the back of your throat?"     Not checked 7. OTHER SYMPTOMS: "Do  you have any other symptoms?" (e.g., difficulty breathing, headache, rash)     no 8. PREGNANCY: "Is there any chance you are pregnant?" "When was your last menstrual period?"     no  Protocols used: SORE THROAT-A-AH

## 2018-11-22 ENCOUNTER — Encounter (HOSPITAL_COMMUNITY): Payer: Self-pay | Admitting: Emergency Medicine

## 2018-11-22 ENCOUNTER — Other Ambulatory Visit: Payer: Self-pay

## 2018-11-22 ENCOUNTER — Emergency Department (HOSPITAL_COMMUNITY)
Admission: EM | Admit: 2018-11-22 | Discharge: 2018-11-22 | Disposition: A | Discharge status: Home / Self Care | Payer: 59 | Attending: Emergency Medicine | Admitting: Emergency Medicine

## 2018-11-22 DIAGNOSIS — R070 Pain in throat: Secondary | ICD-10-CM | POA: Insufficient documentation

## 2018-11-22 DIAGNOSIS — Z79899 Other long term (current) drug therapy: Secondary | ICD-10-CM | POA: Diagnosis not present

## 2018-11-22 DIAGNOSIS — I10 Essential (primary) hypertension: Secondary | ICD-10-CM | POA: Diagnosis not present

## 2018-11-22 DIAGNOSIS — J029 Acute pharyngitis, unspecified: Secondary | ICD-10-CM

## 2018-11-22 DIAGNOSIS — F17210 Nicotine dependence, cigarettes, uncomplicated: Secondary | ICD-10-CM | POA: Insufficient documentation

## 2018-11-22 DIAGNOSIS — M25511 Pain in right shoulder: Secondary | ICD-10-CM | POA: Diagnosis not present

## 2018-11-22 LAB — CBG MONITORING, ED: Glucose-Capillary: 80 mg/dL (ref 70–99)

## 2018-11-22 MED ORDER — CLINDAMYCIN HCL 150 MG PO CAPS
450.0000 mg | ORAL_CAPSULE | Freq: Once | ORAL | Status: AC
  Administered 2018-11-22: 15:00:00 450 mg via ORAL
  Filled 2018-11-22: qty 3

## 2018-11-22 MED ORDER — KETOROLAC TROMETHAMINE 30 MG/ML IJ SOLN
30.0000 mg | Freq: Once | INTRAMUSCULAR | Status: AC
  Administered 2018-11-22: 30 mg via INTRAMUSCULAR
  Filled 2018-11-22: qty 1

## 2018-11-22 MED ORDER — CLINDAMYCIN HCL 150 MG PO CAPS: 450.0000 mg | ORAL_CAPSULE | Freq: Three times a day (TID) | ORAL | 0 refills | Status: AC

## 2018-11-22 MED ORDER — DEXAMETHASONE SODIUM PHOSPHATE 10 MG/ML IJ SOLN
10.0000 mg | Freq: Once | INTRAMUSCULAR | Status: AC
  Administered 2018-11-22: 15:00:00 10 mg via INTRAMUSCULAR
  Filled 2018-11-22: qty 1

## 2018-11-22 NOTE — ED Notes (Signed)
Patient verbalizes understanding of discharge instructions. Opportunity for questioning and answers were provided. Armband removed by staff, pt discharged from ED.  
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
## 2018-11-22 NOTE — ED Notes (Signed)
CBG: 80 

## 2018-11-22 NOTE — ED Triage Notes (Signed)
Pt arrives to ED from [facility] with complaints of sore throat x2days and shoulder pain x1 month. Pt states UC swabbed him for strep and flu which came back negative and COVID which is in process.

## 2018-11-22 NOTE — Discharge Instructions (Signed)
Please read and follow all provided instructions.  Your diagnoses today include:  1. Sore throat   2. Acute pain of right shoulder     Tests performed today include: Strep test: was negative for strep throat Strep culture: you will be notified if this comes back positive Vital signs. See below for your results today.   Medications prescribed:   Please take all of your antibiotics until finished.   You may develop abdominal discomfort or nausea from the antibiotic. If this occurs, you may take it with food. Some patients also get diarrhea with antibiotics. You may help offset this with probiotics which you can buy or get in yogurt. Do not eat or take the probiotics until 2 hours after your antibiotic. Some women develop vaginal yeast infections after antibiotics. If you develop unusual vaginal discharge after being on this medication, please see your primary care provider.   Some people develop allergies to antibiotics. Symptoms of antibiotic allergy can be mild and include a flat rash and itching. They can also be more serious and include:  ?Hives - Hives are raised, red patches of skin that are usually very itchy.  ?Lip or tongue swelling  ?Trouble swallowing or breathing  ?Blistering of the skin or mouth.  If you have any of these serious symptoms, please seek emergency medical care immediately.     Home care instructions:  Please read the educational materials provided and follow any instructions contained in this packet.  Follow-up instructions: Please follow-up with your primary care provider as needed for further evaluation of your symptoms.  Please follow-up with Dr. Constance Holster of ENT within the next week.  Please call him today.  Return instructions:  Please return to the Emergency Department if you experience worsening symptoms.  Return if you have worsening problems swallowing, your neck becomes swollen, you cannot swallow your saliva or your voice becomes muffled.   Return with high persistent fever, persistent vomiting, or if you have trouble breathing.  Please return if you have any other emergent concerns.  Additional Information:  Your vital signs today were: BP (!) 157/111 (BP Location: Right Arm)    Pulse 88    Temp 99 F (37.2 C) (Oral)    Resp 16    SpO2 100%  If your blood pressure (BP) was elevated above 135/85 this visit, please have this repeated by your doctor within one month. --------------

## 2018-11-26 ENCOUNTER — Ambulatory Visit: Payer: 59 | Admitting: Family Medicine

## 2018-12-14 ENCOUNTER — Other Ambulatory Visit: Payer: Self-pay

## 2018-12-14 DIAGNOSIS — Z20822 Contact with and (suspected) exposure to covid-19: Secondary | ICD-10-CM

## 2018-12-19 LAB — NOVEL CORONAVIRUS, NAA: SARS-CoV-2, NAA: NOT DETECTED

## 2019-03-19 ENCOUNTER — Other Ambulatory Visit: Payer: Self-pay | Admitting: Family Medicine

## 2019-03-19 DIAGNOSIS — I10 Essential (primary) hypertension: Secondary | ICD-10-CM

## 2019-03-27 ENCOUNTER — Ambulatory Visit: Payer: Self-pay | Admitting: Family Medicine

## 2019-04-03 ENCOUNTER — Other Ambulatory Visit: Payer: Self-pay

## 2019-04-03 ENCOUNTER — Ambulatory Visit: Payer: 59 | Admitting: Family Medicine

## 2019-04-03 ENCOUNTER — Encounter: Payer: Self-pay | Admitting: Family Medicine

## 2019-04-03 VITALS — BP 158/90 | HR 71 | Temp 98.6°F | Wt 216.0 lb

## 2019-04-03 DIAGNOSIS — K219 Gastro-esophageal reflux disease without esophagitis: Secondary | ICD-10-CM | POA: Diagnosis not present

## 2019-04-03 DIAGNOSIS — F17210 Nicotine dependence, cigarettes, uncomplicated: Secondary | ICD-10-CM

## 2019-04-03 DIAGNOSIS — I10 Essential (primary) hypertension: Secondary | ICD-10-CM | POA: Diagnosis not present

## 2019-04-03 DIAGNOSIS — R7303 Prediabetes: Secondary | ICD-10-CM | POA: Diagnosis not present

## 2019-04-03 DIAGNOSIS — E785 Hyperlipidemia, unspecified: Secondary | ICD-10-CM

## 2019-04-03 MED ORDER — CHANTIX STARTING MONTH PAK 0.5 MG X 11 & 1 MG X 42 PO TABS: ORAL_TABLET | ORAL | 0 refills | Status: DC

## 2019-04-03 MED ORDER — VARENICLINE TARTRATE 1 MG PO TABS: 1.0000 mg | ORAL_TABLET | Freq: Two times a day (BID) | ORAL | 2 refills | Status: DC

## 2019-04-03 NOTE — Patient Instructions (Addendum)
We can try Chantix again - I sent then in with additional info that may get it covered. Keep working with 1-800-quit-now. See other info below.   For blood pressure - Keep a record of your blood pressures outside of the office and send them to me in next week. Cut back to no more than 1-2 alcohol drinks. We can decide on changes with readings at home.    Coping with Quitting Smoking  Quitting smoking is a physical and mental challenge. You will face cravings, withdrawal symptoms, and temptation. Before quitting, work with your health care provider to make a plan that can help you cope. Preparation can help you quit and keep you from giving in. How can I cope with cravings? Cravings usually last for 5-10 minutes. If you get through it, the craving will pass. Consider taking the following actions to help you cope with cravings:  Keep your mouth busy: ? Chew sugar-free gum. ? Suck on hard candies or a straw. ? Brush your teeth.  Keep your hands and body busy: ? Immediately change to a different activity when you feel a craving. ? Squeeze or play with a ball. ? Do an activity or a hobby, like making bead jewelry, practicing needlepoint, or working with wood. ? Mix up your normal routine. ? Take a short exercise break. Go for a quick walk or run up and down stairs. ? Spend time in public places where smoking is not allowed.  Focus on doing something kind or helpful for someone else.  Call a friend or family member to talk during a craving.  Join a support group.  Call a quit line, such as 1-800-QUIT-NOW.  Talk with your health care provider about medicines that might help you cope with cravings and make quitting easier for you. How can I deal with withdrawal symptoms? Your body may experience negative effects as it tries to get used to not having nicotine in the system. These effects are called withdrawal symptoms. They may include:  Feeling hungrier than normal.  Trouble  concentrating.  Irritability.  Trouble sleeping.  Feeling depressed.  Restlessness and agitation.  Craving a cigarette. To manage withdrawal symptoms:  Avoid places, people, and activities that trigger your cravings.  Remember why you want to quit.  Get plenty of sleep.  Avoid coffee and other caffeinated drinks. These may worsen some of your symptoms. How can I handle social situations? Social situations can be difficult when you are quitting smoking, especially in the first few weeks. To manage this, you can:  Avoid parties, bars, and other social situations where people might be smoking.  Avoid alcohol.  Leave right away if you have the urge to smoke.  Explain to your family and friends that you are quitting smoking. Ask for understanding and support.  Plan activities with friends or family where smoking is not an option. What are some ways I can cope with stress? Wanting to smoke may cause stress, and stress can make you want to smoke. Find ways to manage your stress. Relaxation techniques can help. For example:  Breathe slowly and deeply, in through your nose and out through your mouth.  Listen to soothing, relaxing music.  Talk with a family member or friend about your stress.  Light a candle.  Soak in a bath or take a shower.  Think about a peaceful place. What are some ways I can prevent weight gain? Be aware that many people gain weight after they quit smoking. However, not  everyone does. To keep from gaining weight, have a plan in place before you quit and stick to the plan after you quit. Your plan should include:  Having healthy snacks. When you have a craving, it may help to: ? Eat plain popcorn, crunchy carrots, celery, or other cut vegetables. ? Chew sugar-free gum.  Changing how you eat: ? Eat small portion sizes at meals. ? Eat 4-6 small meals throughout the day instead of 1-2 large meals a day. ? Be mindful when you eat. Do not watch  television or do other things that might distract you as you eat.  Exercising regularly: ? Make time to exercise each day. If you do not have time for a long workout, do short bouts of exercise for 5-10 minutes several times a day. ? Do some form of strengthening exercise, like weight lifting, and some form of aerobic exercise, like running or swimming.  Drinking plenty of water or other low-calorie or no-calorie drinks. Drink 6-8 glasses of water daily, or as much as instructed by your health care provider. Summary  Quitting smoking is a physical and mental challenge. You will face cravings, withdrawal symptoms, and temptation to smoke again. Preparation can help you as you go through these challenges.  You can cope with cravings by keeping your mouth busy (such as by chewing gum), keeping your body and hands busy, and making calls to family, friends, or a helpline for people who want to quit smoking.  You can cope with withdrawal symptoms by avoiding places where people smoke, avoiding drinks with caffeine, and getting plenty of rest.  Ask your health care provider about the different ways to prevent weight gain, avoid stress, and handle social situations. This information is not intended to replace advice given to you by your health care provider. Make sure you discuss any questions you have with your health care provider. Document Released: 05/13/2016 Document Revised: 04/28/2017 Document Reviewed: 05/13/2016 Elsevier Patient Education  [employer].   If you have lab work done today you will be contacted with your lab results within the next 2 weeks.  If you have not heard from [place] then please contact us. The fastest way to get your results is to register for My Chart.   IF you received an x-ray today, you will receive an invoice from [facility]. Please contact [facility] at [phone] with questions or concerns regarding your invoice.   IF you received  labwork today, you will receive an invoice from [facility]. Please contact [facility] at [phone] with questions or concerns regarding your invoice.   Our billing staff will not be able to assist you with questions regarding bills from these companies.  You will be contacted with the lab results as soon as they are available. The fastest way to get your results is to activate your My Chart account. Instructions are located on the last page of this paperwork. If you have not heard from [place] regarding the results in 2 weeks, please contact this office.

## 2019-04-03 NOTE — Progress Notes (Signed)
Subjective:    Patient ID: Scott Galloway, male    DOB: 1971-04-03, 48 y.o.   MRN: [id number]  HPI Scott Galloway is a 48 y.o. male Presents today for: Chief Complaint  Patient presents with  . Follow-up    Follow up on BP and pt wants to speak with provider about smoking cessation meds   Hypertension:  Stable when discussed in March.  Elevated today as well as June 25.  Currently on Norvasc 10 mg daily. No missed doses usually, not yet taken today (usually in am) No recent home monitoring. Not feeling sluggish as when high in past.  Alcohol: none during week, 4-5 per day on weekends. Less exercise recently with 12 hour shifts.  BP Readings from Last 3 Encounters:  04/03/19 (!) 158/90  11/22/18 (!) 157/111  08/13/18 138/86   Lab Results  Component Value Date   CREATININE 1.09 08/13/2018     Prediabetes: Lab Results  Component Value Date   HGBA1C 5.8 (H) 08/13/2018   Wt Readings from Last 3 Encounters:  04/03/19 216 lb (98 kg)  08/13/18 225 lb (102.1 kg)  12/26/17 223 lb 12.8 oz (101.5 kg)  some difficulty with exercise, but physical job.   Hyperlipidemia: No current meds. Planned diet/exercise changes.  The 10-year ASCVD risk score Mikey Bussing DC Brooke Bonito., et al., 2013) is: 22.1%   Values used to calculate the score:     Age: 51 years     Sex: Male     Is Non-Hispanic African American: Yes     Diabetic: No     Tobacco smoker: Yes     Systolic Blood Pressure: 202 mmHg     Is BP treated: Yes     HDL Cholesterol: 39 mg/dL     Total Cholesterol: 214 mg/dL  Lab Results  Component Value Date   CHOL 214 (H) 08/13/2018   HDL 39 (L) 08/13/2018   LDLCALC 151 (H) 08/13/2018   TRIG 119 08/13/2018   CHOLHDL 5.5 (H) 08/13/2018   Lab Results  Component Value Date   ALT 14 08/13/2018   AST 18 08/13/2018   ALKPHOS 78 08/13/2018   BILITOT <0.2 08/13/2018   Last ate 4.5 hrs ago.   Nicotine addiction Smoking cessation has been discussed previously.  He was tolerating  Zyban in March but was considering nicotine patch.  Handout was given on coping with quitting smoking at that time.  Previously Chantix was not covered. Telephone note from May.  Plan for trying to get Chantix again as the Zyban reportedly was not working as well.  Has tried patches which did not work as well either. Total quit attempts - twice.  Currently over 1/2 ppd.  Feels like chantix works with cravings.min bad dreams initially, but tolerated.  Took chantix for few months, but then stopped. Has not tried filling Chantix recently.  Has called 1-800 quit now.  4 min conversation.   GERD: Stable on QOD dosing of PPI.    Review of Systems  Constitutional: Negative for fatigue and unexpected weight change.  Eyes: Negative for visual disturbance.  Respiratory: Negative for cough, chest tightness and shortness of breath.   Cardiovascular: Negative for chest pain, palpitations and leg swelling.  Gastrointestinal: Negative for abdominal pain and blood in stool.  Neurological: Negative for dizziness, light-headedness and headaches.       Objective:   Physical Exam Vitals signs reviewed.  Constitutional:      Appearance: He is well-developed.  HENT:  Head: Normocephalic and atraumatic.  Eyes:     Pupils: Pupils are equal, round, and reactive to light.  Neck:     Vascular: No carotid bruit or JVD.  Cardiovascular:     Rate and Rhythm: Normal rate and regular rhythm.     Heart sounds: Normal heart sounds. No murmur.  Pulmonary:     Effort: Pulmonary effort is normal.     Breath sounds: Normal breath sounds. No rales.  Skin:    General: Skin is warm and dry.  Neurological:     Mental Status: He is alert and oriented to person, place, and time.    Vitals:   04/03/19 1020 04/03/19 1021  BP: (!) 157/87 (!) 158/90  Pulse: 71   Temp: 98.6 F (37 C)   TempSrc: Oral   SpO2: 98%   Weight: 216 lb (98 kg)           Assessment & Plan:    Scott Galloway is a 48 y.o.  male Essential hypertension - Plan: Comprehensive metabolic panel  -Has not taken meds yet today.  Possible decreased control as well.  Plan to decrease alcohol use.  Smoking cessation as below.  Home readings to be sent over the next 1 week by MyChart.   Cigarette nicotine dependence without complication - Plan: varenicline (CHANTIX STARTING MONTH PAK) 0.5 MG X 11 & 1 MG X 42 tablet, varenicline (CHANTIX CONTINUING MONTH PAK) 1 MG tablet  -Chantix worked well previously.  Will order again, failed multiple other approaches.  Gastroesophageal reflux disease without esophagitis  -Stable with intermittent PPI usage.  Decreased alcohol and smoking should also help  Prediabetes - Plan: Hemoglobin A1c  -Commended on weight loss.  Repeat labs  Hyperlipidemia, unspecified hyperlipidemia type - Plan: Comprehensive metabolic panel, Lipid panel  -Elevated ASCVD risk score discussed.  Check lipids, CMP.  Continue diet/exercise approach.  Will discuss need for statin based on current labs at follow-up  Meds ordered this encounter  Medications  . varenicline (CHANTIX STARTING MONTH PAK) 0.5 MG X 11 & 1 MG X 42 tablet    Sig: Take one 0.5 mg tablet by mouth once daily for 3 days, then increase to one 0.5 mg tablet twice daily for 4 days, then increase to one 1 mg tablet twice daily.    Dispense:  53 tablet    Refill:  0    Failed nicotine patches, failed Zyban. Currently working with 1-800-quit now as well to help with cessation.  . varenicline (CHANTIX CONTINUING MONTH PAK) 1 MG tablet    Sig: Take 1 tablet (1 mg total) by mouth 2 (two) times daily.    Dispense:  60 tablet    Refill:  2   Patient Instructions    We can try Chantix again - I sent then in with additional info that may get it covered. Keep working with 1-800-quit-now. See other info below.   For blood pressure - Keep a record of your blood pressures outside of the office and send them to me in next week. Cut back to no more than 1-2  alcohol drinks. We can decide on changes with readings at home.    Coping with Quitting Smoking  Quitting smoking is a physical and mental challenge. You will face cravings, withdrawal symptoms, and temptation. Before quitting, work with your health care provider to make a plan that can help you cope. Preparation can help you quit and keep you from giving in. How can I cope with  cravings? Cravings usually last for 5-10 minutes. If you get through it, the craving will pass. Consider taking the following actions to help you cope with cravings:  Keep your mouth busy: ? Chew sugar-free gum. ? Suck on hard candies or a straw. ? Brush your teeth.  Keep your hands and body busy: ? Immediately change to a different activity when you feel a craving. ? Squeeze or play with a ball. ? Do an activity or a hobby, like making bead jewelry, practicing needlepoint, or working with wood. ? Mix up your normal routine. ? Take a short exercise break. Go for a quick walk or run up and down stairs. ? Spend time in public places where smoking is not allowed.  Focus on doing something kind or helpful for someone else.  Call a friend or family member to talk during a craving.  Join a support group.  Call a quit line, such as 1-800-QUIT-NOW.  Talk with your health care provider about medicines that might help you cope with cravings and make quitting easier for you. How can I deal with withdrawal symptoms? Your body may experience negative effects as it tries to get used to not having nicotine in the system. These effects are called withdrawal symptoms. They may include:  Feeling hungrier than normal.  Trouble concentrating.  Irritability.  Trouble sleeping.  Feeling depressed.  Restlessness and agitation.  Craving a cigarette. To manage withdrawal symptoms:  Avoid places, people, and activities that trigger your cravings.  Remember why you want to quit.  Get plenty of sleep.  Avoid coffee  and other caffeinated drinks. These may worsen some of your symptoms. How can I handle social situations? Social situations can be difficult when you are quitting smoking, especially in the first few weeks. To manage this, you can:  Avoid parties, bars, and other social situations where people might be smoking.  Avoid alcohol.  Leave right away if you have the urge to smoke.  Explain to your family and friends that you are quitting smoking. Ask for understanding and support.  Plan activities with friends or family where smoking is not an option. What are some ways I can cope with stress? Wanting to smoke may cause stress, and stress can make you want to smoke. Find ways to manage your stress. Relaxation techniques can help. For example:  Breathe slowly and deeply, in through your nose and out through your mouth.  Listen to soothing, relaxing music.  Talk with a family member or friend about your stress.  Light a candle.  Soak in a bath or take a shower.  Think about a peaceful place. What are some ways I can prevent weight gain? Be aware that many people gain weight after they quit smoking. However, not everyone does. To keep from gaining weight, have a plan in place before you quit and stick to the plan after you quit. Your plan should include:  Having healthy snacks. When you have a craving, it may help to: ? Eat plain popcorn, crunchy carrots, celery, or other cut vegetables. ? Chew sugar-free gum.  Changing how you eat: ? Eat small portion sizes at meals. ? Eat 4-6 small meals throughout the day instead of 1-2 large meals a day. ? Be mindful when you eat. Do not watch television or do other things that might distract you as you eat.  Exercising regularly: ? Make time to exercise each day. If you do not have time for a long workout, do short bouts  of exercise for 5-10 minutes several times a day. ? Do some form of strengthening exercise, like weight lifting, and some form  of aerobic exercise, like running or swimming.  Drinking plenty of water or other low-calorie or no-calorie drinks. Drink 6-8 glasses of water daily, or as much as instructed by your health care provider. Summary  Quitting smoking is a physical and mental challenge. You will face cravings, withdrawal symptoms, and temptation to smoke again. Preparation can help you as you go through these challenges.  You can cope with cravings by keeping your mouth busy (such as by chewing gum), keeping your body and hands busy, and making calls to family, friends, or a helpline for people who want to quit smoking.  You can cope with withdrawal symptoms by avoiding places where people smoke, avoiding drinks with caffeine, and getting plenty of rest.  Ask your health care provider about the different ways to prevent weight gain, avoid stress, and handle social situations. This information is not intended to replace advice given to you by your health care provider. Make sure you discuss any questions you have with your health care provider. Document Released: 05/13/2016 Document Revised: 04/28/2017 Document Reviewed: 05/13/2016 Elsevier Patient Education  [employer].   If you have lab work done today you will be contacted with your lab results within the next 2 weeks.  If you have not heard from [place] then please contact us. The fastest way to get your results is to register for My Chart.   IF you received an x-ray today, you will receive an invoice from [facility]. Please contact [facility] at [phone] with questions or concerns regarding your invoice.   IF you received labwork today, you will receive an invoice from [facility]. Please contact [facility] at [phone] with questions or concerns regarding your invoice.   Our billing staff will not be able to assist you with questions regarding bills from these companies.  You will be contacted with the lab results as soon as they  are available. The fastest way to get your results is to activate your My Chart account. Instructions are located on the last page of this paperwork. If you have not heard from [place] regarding the results in 2 weeks, please contact this office.       Signed,   Meredith Staggers, MD [facility].  04/03/19 12:57 PM

## 2019-04-04 LAB — COMPREHENSIVE METABOLIC PANEL
ALT: 12 IU/L (ref 0–44)
AST: 17 IU/L (ref 0–40)
Albumin/Globulin Ratio: 1.8 (ref 1.2–2.2)
Albumin: 4.6 g/dL (ref 4.0–5.0)
Alkaline Phosphatase: 89 IU/L (ref 39–117)
BUN/Creatinine Ratio: 9 (ref 9–20)
BUN: 9 mg/dL (ref 6–24)
Bilirubin Total: 0.3 mg/dL (ref 0.0–1.2)
CO2: 22 mmol/L (ref 20–29)
Calcium: 9.8 mg/dL (ref 8.7–10.2)
Chloride: 104 mmol/L (ref 96–106)
Creatinine, Ser: 1.02 mg/dL (ref 0.76–1.27)
GFR calc Af Amer: 99 mL/min/{1.73_m2} (ref >59)
GFR calc non Af Amer: 86 mL/min/{1.73_m2} (ref >59)
Globulin, Total: 2.5 g/dL (ref 1.5–4.5)
Glucose: 97 mg/dL (ref 65–99)
Potassium: 4.5 mmol/L (ref 3.5–5.2)
Sodium: 140 mmol/L (ref 134–144)
Total Protein: 7.1 g/dL (ref 6.0–8.5)

## 2019-04-04 LAB — LIPID PANEL
Chol/HDL Ratio: 4.4 ratio (ref 0.0–5.0)
Cholesterol, Total: 214 mg/dL — ABNORMAL HIGH (ref 100–199)
HDL: 49 mg/dL (ref >39)
LDL Chol Calc (NIH): 151 mg/dL — ABNORMAL HIGH (ref 0–99)
Triglycerides: 76 mg/dL (ref 0–149)
VLDL Cholesterol Cal: 14 mg/dL (ref 5–40)

## 2019-04-04 LAB — HEMOGLOBIN A1C
Est. average glucose Bld gHb Est-mCnc: 128 mg/dL
Hgb A1c MFr Bld: 6.1 % — ABNORMAL HIGH (ref 4.8–5.6)

## 2019-05-06 ENCOUNTER — Ambulatory Visit: Payer: 59 | Admitting: Family Medicine

## 2019-05-07 ENCOUNTER — Encounter: Payer: Self-pay | Admitting: Family Medicine

## 2019-05-28 ENCOUNTER — Other Ambulatory Visit: Payer: Self-pay | Admitting: Family Medicine

## 2019-05-28 DIAGNOSIS — I10 Essential (primary) hypertension: Secondary | ICD-10-CM

## 2019-05-30 ENCOUNTER — Other Ambulatory Visit: Payer: Self-pay | Admitting: Family Medicine

## 2019-05-30 DIAGNOSIS — F17210 Nicotine dependence, cigarettes, uncomplicated: Secondary | ICD-10-CM

## 2019-07-02 ENCOUNTER — Other Ambulatory Visit: Payer: Self-pay | Admitting: Family Medicine

## 2019-07-02 DIAGNOSIS — K219 Gastro-esophageal reflux disease without esophagitis: Secondary | ICD-10-CM

## 2019-07-02 NOTE — Telephone Encounter (Signed)
Requested Prescriptions  Pending Prescriptions Disp Refills  . omeprazole (PRILOSEC) 20 MG capsule [Pharmacy Med Name: Omeprazole 20 MG Oral Capsule Delayed Release] 90 capsule 0    Sig: Take 1 capsule by mouth once daily     Gastroenterology: Proton Pump Inhibitors Passed - 07/02/2019  2:10 PM      Passed - Valid encounter within last 12 months    Recent Outpatient Visits          3 months ago Essential hypertension   [facility], Asencion Partridge, MD   10 months ago Gastroesophageal reflux disease without esophagitis   [facility], Asencion Partridge, MD   1 year ago Essential hypertension   [facility], Asencion Partridge, MD   1 year ago Essential hypertension   [facility], Asencion Partridge, MD   2 years ago Essential hypertension   [facility], Asencion Partridge, MD

## 2019-10-12 ENCOUNTER — Other Ambulatory Visit: Payer: Self-pay | Admitting: Family Medicine

## 2019-10-12 DIAGNOSIS — K219 Gastro-esophageal reflux disease without esophagitis: Secondary | ICD-10-CM

## 2019-10-12 NOTE — Telephone Encounter (Signed)
Requested Prescriptions  Pending Prescriptions Disp Refills  . omeprazole (PRILOSEC) 20 MG capsule [Pharmacy Med Name: Omeprazole 20 MG Oral Capsule Delayed Release] 90 capsule 1    Sig: Take 1 capsule by mouth once daily     Gastroenterology: Proton Pump Inhibitors Passed - 10/12/2019 10:29 AM      Passed - Valid encounter within last 12 months    Recent Outpatient Visits          6 months ago Essential hypertension   [facility], Asencion Partridge, MD   1 year ago Gastroesophageal reflux disease without esophagitis   [facility], Asencion Partridge, MD   1 year ago Essential hypertension   [facility], Asencion Partridge, MD   2 years ago Essential hypertension   [facility], Asencion Partridge, MD   2 years ago Essential hypertension   [facility], Asencion Partridge, MD

## 2019-12-18 ENCOUNTER — Other Ambulatory Visit: Payer: Self-pay | Admitting: Family Medicine

## 2019-12-18 DIAGNOSIS — I10 Essential (primary) hypertension: Secondary | ICD-10-CM

## 2019-12-18 NOTE — Telephone Encounter (Signed)
Courtesy refill. Made appt for 12/26/19.

## 2019-12-26 ENCOUNTER — Ambulatory Visit: Payer: Self-pay | Admitting: Family Medicine

## 2019-12-30 ENCOUNTER — Ambulatory Visit: Payer: Self-pay | Admitting: Family Medicine

## 2019-12-31 ENCOUNTER — Encounter: Payer: Self-pay | Admitting: Family Medicine

## 2020-01-13 ENCOUNTER — Ambulatory Visit: Payer: 59 | Admitting: Family Medicine

## 2020-01-14 ENCOUNTER — Encounter: Payer: Self-pay | Admitting: Family Medicine

## 2020-01-17 ENCOUNTER — Telehealth: Payer: Self-pay | Admitting: Family Medicine

## 2020-01-17 DIAGNOSIS — I10 Essential (primary) hypertension: Secondary | ICD-10-CM

## 2020-01-17 MED ORDER — AMLODIPINE BESYLATE 10 MG PO TABS: 10.0000 mg | ORAL_TABLET | Freq: Every day | ORAL | 0 refills | Status: DC

## 2020-01-17 NOTE — Telephone Encounter (Signed)
[facility] refill has been sent to the [facility] per provider.

## 2020-01-17 NOTE — Telephone Encounter (Signed)
What is the name of the medication? amLODipine (NORVASC) 10 MG tablet [[id number]]    Have you contacted your pharmacy to request a refill? Yes, he needs an ov. He would like a curtesy refill. He has an upcoming appointment with Neva Seat on 01/23/20.  Which pharmacy would you like this sent to? [facility] [address].   Patient notified that their request is being sent to the clinical staff for review and that they should receive a call once it is complete. If they do not receive a call within 72 hours they can check with their pharmacy or our office.

## 2020-01-23 ENCOUNTER — Ambulatory Visit: Payer: 59 | Admitting: Family Medicine

## 2020-01-23 ENCOUNTER — Encounter: Payer: Self-pay | Admitting: Family Medicine

## 2020-01-23 ENCOUNTER — Other Ambulatory Visit: Payer: Self-pay

## 2020-01-23 VITALS — BP 129/87 | HR 64 | Temp 97.8°F | Ht 74.0 in | Wt 216.0 lb

## 2020-01-23 DIAGNOSIS — H538 Other visual disturbances: Secondary | ICD-10-CM

## 2020-01-23 DIAGNOSIS — I10 Essential (primary) hypertension: Secondary | ICD-10-CM | POA: Diagnosis not present

## 2020-01-23 DIAGNOSIS — F17210 Nicotine dependence, cigarettes, uncomplicated: Secondary | ICD-10-CM | POA: Diagnosis not present

## 2020-01-23 DIAGNOSIS — R351 Nocturia: Secondary | ICD-10-CM

## 2020-01-23 DIAGNOSIS — G4700 Insomnia, unspecified: Secondary | ICD-10-CM

## 2020-01-23 DIAGNOSIS — R7303 Prediabetes: Secondary | ICD-10-CM | POA: Diagnosis not present

## 2020-01-23 DIAGNOSIS — R0683 Snoring: Secondary | ICD-10-CM

## 2020-01-23 DIAGNOSIS — R5383 Other fatigue: Secondary | ICD-10-CM

## 2020-01-23 LAB — POCT GLYCOSYLATED HEMOGLOBIN (HGB A1C): Hemoglobin A1C: 6 % — AB (ref 4.0–5.6)

## 2020-01-23 LAB — POCT URINALYSIS DIP (MANUAL ENTRY)
Bilirubin, UA: NEGATIVE
Blood, UA: NEGATIVE
Glucose, UA: NEGATIVE mg/dL
Ketones, POC UA: NEGATIVE mg/dL
Leukocytes, UA: NEGATIVE
Nitrite, UA: NEGATIVE
Protein Ur, POC: NEGATIVE mg/dL
Spec Grav, UA: 1.02 (ref 1.010–1.025)
Urobilinogen, UA: 1 E.U./dL
pH, UA: 7.5 (ref 5.0–8.0)

## 2020-01-23 LAB — GLUCOSE, POCT (MANUAL RESULT ENTRY): POC Glucose: 96 mg/dl (ref 70–99)

## 2020-01-23 MED ORDER — VARENICLINE TARTRATE 1 MG PO TABS: 1.0000 mg | ORAL_TABLET | Freq: Two times a day (BID) | ORAL | 2 refills | Status: DC

## 2020-01-23 MED ORDER — CHANTIX STARTING MONTH PAK 0.5 MG X 11 & 1 MG X 42 PO TABS: ORAL_TABLET | ORAL | 0 refills | Status: DC

## 2020-01-23 NOTE — Patient Instructions (Addendum)
Tylenol ok for now, please follow up for left arm symptoms in next 2 weeks or with other provider if needed sooner. If any worsening or chest symptoms as we discussed - be seen in emergency room.   Restart chantix for smoking cessation.   You will likely need to start on a cholesterol medicine, I will check levels today and will discuss next visit.   I will refer you to sleep specialist. Ok to try melatonin. Do not drive or operate machinery if tired.   Return to the clinic or go to the nearest emergency room if any of your symptoms worsen or new symptoms occur.  Recheck in 2 weeks.     Coping with Quitting Smoking  Quitting smoking is a physical and mental challenge. You will face cravings, withdrawal symptoms, and temptation. Before quitting, work with your health care provider to make a plan that can help you cope. Preparation can help you quit and keep you from giving in. How can I cope with cravings? Cravings usually last for 5-10 minutes. If you get through it, the craving will pass. Consider taking the following actions to help you cope with cravings:  Keep your mouth busy: ? Chew sugar-free gum. ? Suck on hard candies or a straw. ? Brush your teeth.  Keep your hands and body busy: ? Immediately change to a different activity when you feel a craving. ? Squeeze or play with a ball. ? Do an activity or a hobby, like making bead jewelry, practicing needlepoint, or working with wood. ? Mix up your normal routine. ? Take a short exercise break. Go for a quick walk or run up and down stairs. ? Spend time in public places where smoking is not allowed.  Focus on doing something kind or helpful for someone else.  Call a friend or family member to talk during a craving.  Join a support group.  Call a quit line, such as 1-800-QUIT-NOW.  Talk with your health care provider about medicines that might help you cope with cravings and make quitting easier for you. How can I deal  with withdrawal symptoms? Your body may experience negative effects as it tries to get used to not having nicotine in the system. These effects are called withdrawal symptoms. They may include:  Feeling hungrier than normal.  Trouble concentrating.  Irritability.  Trouble sleeping.  Feeling depressed.  Restlessness and agitation.  Craving a cigarette. To manage withdrawal symptoms:  Avoid places, people, and activities that trigger your cravings.  Remember why you want to quit.  Get plenty of sleep.  Avoid coffee and other caffeinated drinks. These may worsen some of your symptoms. How can I handle social situations? Social situations can be difficult when you are quitting smoking, especially in the first few weeks. To manage this, you can:  Avoid parties, bars, and other social situations where people might be smoking.  Avoid alcohol.  Leave right away if you have the urge to smoke.  Explain to your family and friends that you are quitting smoking. Ask for understanding and support.  Plan activities with friends or family where smoking is not an option. What are some ways I can cope with stress? Wanting to smoke may cause stress, and stress can make you want to smoke. Find ways to manage your stress. Relaxation techniques can help. For example:  Breathe slowly and deeply, in through your nose and out through your mouth.  Listen to soothing, relaxing music.  Talk with a family  member or friend about your stress.  Light a candle.  Soak in a bath or take a shower.  Think about a peaceful place. What are some ways I can prevent weight gain? Be aware that many people gain weight after they quit smoking. However, not everyone does. To keep from gaining weight, have a plan in place before you quit and stick to the plan after you quit. Your plan should include:  Having healthy snacks. When you have a craving, it may help to: ? Eat plain popcorn, crunchy carrots,  celery, or other cut vegetables. ? Chew sugar-free gum.  Changing how you eat: ? Eat small portion sizes at meals. ? Eat 4-6 small meals throughout the day instead of 1-2 large meals a day. ? Be mindful when you eat. Do not watch television or do other things that might distract you as you eat.  Exercising regularly: ? Make time to exercise each day. If you do not have time for a long workout, do short bouts of exercise for 5-10 minutes several times a day. ? Do some form of strengthening exercise, like weight lifting, and some form of aerobic exercise, like running or swimming.  Drinking plenty of water or other low-calorie or no-calorie drinks. Drink 6-8 glasses of water daily, or as much as instructed by your health care provider. Summary  Quitting smoking is a physical and mental challenge. You will face cravings, withdrawal symptoms, and temptation to smoke again. Preparation can help you as you go through these challenges.  You can cope with cravings by keeping your mouth busy (such as by chewing gum), keeping your body and hands busy, and making calls to family, friends, or a helpline for people who want to quit smoking.  You can cope with withdrawal symptoms by avoiding places where people smoke, avoiding drinks with caffeine, and getting plenty of rest.  Ask your health care provider about the different ways to prevent weight gain, avoid stress, and handle social situations. This information is not intended to replace advice given to you by your health care provider. Make sure you discuss any questions you have with your health care provider. Document Revised: 04/28/2017 Document Reviewed: 05/13/2016 Elsevier Patient Education  [employer].    If you have lab work done today you will be contacted with your lab results within the next 2 weeks.  If you have not heard from [place] then please contact us. The fastest way to get your results is to register for My Chart.   IF you  received an x-ray today, you will receive an invoice from [facility]. Please contact [facility] at [phone] with questions or concerns regarding your invoice.   IF you received labwork today, you will receive an invoice from [facility]. Please contact [facility] at [phone] with questions or concerns regarding your invoice.   Our billing staff will not be able to assist you with questions regarding bills from these companies.  You will be contacted with the lab results as soon as they are available. The fastest way to get your results is to activate your My Chart account. Instructions are located on the last page of this paperwork. If you have not heard from [place] regarding the results in 2 weeks, please contact this office.
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
## 2020-01-24 LAB — COMPREHENSIVE METABOLIC PANEL
ALT: 13 IU/L (ref 0–44)
AST: 15 IU/L (ref 0–40)
Albumin/Globulin Ratio: 1.6 (ref 1.2–2.2)
Albumin: 4.4 g/dL (ref 4.0–5.0)
Alkaline Phosphatase: 81 IU/L (ref 48–121)
BUN/Creatinine Ratio: 11 (ref 9–20)
BUN: 10 mg/dL (ref 6–24)
Bilirubin Total: 0.3 mg/dL (ref 0.0–1.2)
CO2: 22 mmol/L (ref 20–29)
Calcium: 9.2 mg/dL (ref 8.7–10.2)
Chloride: 106 mmol/L (ref 96–106)
Creatinine, Ser: 0.92 mg/dL (ref 0.76–1.27)
GFR calc Af Amer: 113 mL/min/{1.73_m2} (ref >59)
GFR calc non Af Amer: 97 mL/min/{1.73_m2} (ref >59)
Globulin, Total: 2.7 g/dL (ref 1.5–4.5)
Glucose: 102 mg/dL — ABNORMAL HIGH (ref 65–99)
Potassium: 4.2 mmol/L (ref 3.5–5.2)
Sodium: 140 mmol/L (ref 134–144)
Total Protein: 7.1 g/dL (ref 6.0–8.5)

## 2020-01-24 LAB — LIPID PANEL
Chol/HDL Ratio: 5.1 ratio — ABNORMAL HIGH (ref 0.0–5.0)
Cholesterol, Total: 224 mg/dL — ABNORMAL HIGH (ref 100–199)
HDL: 44 mg/dL (ref >39)
LDL Chol Calc (NIH): 149 mg/dL — ABNORMAL HIGH (ref 0–99)
Triglycerides: 171 mg/dL — ABNORMAL HIGH (ref 0–149)
VLDL Cholesterol Cal: 31 mg/dL (ref 5–40)

## 2020-01-24 LAB — PSA: Prostate Specific Ag, Serum: 1.1 ng/mL (ref 0.0–4.0)

## 2020-01-25 ENCOUNTER — Encounter: Payer: Self-pay | Admitting: Family Medicine

## 2020-02-12 ENCOUNTER — Ambulatory Visit: Payer: 59 | Admitting: Family Medicine

## 2020-02-12 ENCOUNTER — Encounter: Payer: Self-pay | Admitting: Family Medicine

## 2020-02-12 ENCOUNTER — Ambulatory Visit (INDEPENDENT_AMBULATORY_CARE_PROVIDER_SITE_OTHER): Payer: 59

## 2020-02-12 ENCOUNTER — Other Ambulatory Visit: Payer: Self-pay

## 2020-02-12 VITALS — BP 137/89 | HR 71 | Temp 98.7°F | Resp 16 | Ht 74.0 in | Wt 212.4 lb

## 2020-02-12 DIAGNOSIS — M79651 Pain in right thigh: Secondary | ICD-10-CM

## 2020-02-12 DIAGNOSIS — R7303 Prediabetes: Secondary | ICD-10-CM | POA: Diagnosis not present

## 2020-02-12 DIAGNOSIS — G4700 Insomnia, unspecified: Secondary | ICD-10-CM

## 2020-02-12 DIAGNOSIS — R0683 Snoring: Secondary | ICD-10-CM

## 2020-02-12 DIAGNOSIS — F17210 Nicotine dependence, cigarettes, uncomplicated: Secondary | ICD-10-CM

## 2020-02-12 DIAGNOSIS — E785 Hyperlipidemia, unspecified: Secondary | ICD-10-CM | POA: Diagnosis not present

## 2020-02-12 MED ORDER — NICOTINE 14 MG/24HR TD PT24: 14.0000 mg | MEDICATED_PATCH | Freq: Every day | TRANSDERMAL | 1 refills | Status: DC

## 2020-02-12 NOTE — Patient Instructions (Addendum)
If arm numbness returns, follow up to discuss further.  Can try nicotine patch. 1-800-quit-now. Follow up in 1 month to discuss smoking.  Will start at 14 mg/day patch, then when you follow-up in a month can talk about decreasing that down to 7 mg/day if doing well at 6 weeks.  Call sleep specialist to arrange appointment: [facility] [place] Address: [address], [place], [place] [address] Phone: [phone]  Watch diet/exercise for cholesterol. Recheck levels in 6 months, but certainly could start meds sooner if you would like.   I will let you know if there are any concerns on your x-ray.  If that area on your thigh continues, we may need to do an ultrasound to look at the soft tissue further.  Let me know in the next 1 week.  Follow-up sooner if any redness, rash, or sore areas noted elsewhere in leg.   Steps to Quit Smoking Smoking tobacco is the leading cause of preventable death. It can affect almost every organ in the body. Smoking puts you and those around you at risk for developing many serious chronic diseases. Quitting smoking can be difficult, but it is one of the best things that you can do for your health. It is never too late to quit. How do I get ready to quit? When you decide to quit smoking, create a plan to help you succeed. Before you quit:  Pick a date to quit. Set a date within the next 2 weeks to give you time to prepare.  Write down the reasons why you are quitting. Keep this list in places where you will see it often.  Tell your family, friends, and co-workers that you are quitting. Support from your loved ones can make quitting easier.  Talk with your health care provider about your options for quitting smoking.  Find out what treatment options are covered by your health insurance.  Identify people, places, things, and activities that make you want to smoke (triggers). Avoid them. What  first steps can I take to quit smoking?  Throw away all cigarettes at home, at work, and in your car.  Throw away smoking accessories, such as Set designer.  Clean your car. Make sure to empty the ashtray.  Clean your home, including curtains and carpets. What strategies can I use to quit smoking? Talk with your health care provider about combining strategies, such as taking medicines while you are also receiving in-person counseling. Using these two strategies together makes you more likely to succeed in quitting than if you used either strategy on its own.  If you are pregnant or breastfeeding, talk with your health care provider about finding counseling or other support strategies to quit smoking. Do not take medicine to help you quit smoking unless your health care provider tells you to do so. To quit smoking: Quit right away  Quit smoking completely, instead of gradually reducing how much you smoke over a period of time. Research shows that stopping smoking right away is more successful than gradually quitting.  Attend in-person counseling to help you build problem-solving skills. You are more likely to succeed in quitting if you attend counseling sessions regularly. Even short sessions of 10 minutes can be effective. Take medicine You may take medicines to help you quit smoking. Some medicines require a prescription and some you can purchase over-the-counter. Medicines may have nicotine in them to replace the nicotine in cigarettes. Medicines may:  Help to stop cravings.  Help to relieve withdrawal symptoms. Your health care provider may recommend:  Nicotine patches, gum, or lozenges.  Nicotine inhalers or sprays.  Non-nicotine medicine that is taken by mouth. Find resources Find resources and support systems that can help you to quit smoking and remain smoke-free after you quit. These resources are most helpful when you use them often. They include:  Online chats  with a Veterinary surgeon.  Telephone quitlines.  Printed Materials engineer.  Support groups or group counseling.  Text messaging programs.  Mobile phone apps or applications. Use apps that can help you stick to your quit plan by providing reminders, tips, and encouragement. There are many free apps for mobile devices as well as websites. Examples include Quit Guide from the [employer] and smokefree.gov What things can I do to make it easier to quit?   Reach out to your family and friends for support and encouragement. Call telephone quitlines (1-800-QUIT-NOW), reach out to support groups, or work with a counselor for support.  Ask people who smoke to avoid smoking around you.  Avoid places that trigger you to smoke, such as bars, parties, or smoke-break areas at work.  Spend time with people who do not smoke.  Lessen the stress in your life. Stress can be a smoking trigger for some people. To lessen stress, try: ? Exercising regularly. ? Doing deep-breathing exercises. ? Doing yoga. ? Meditating. ? Performing a body scan. This involves closing your eyes, scanning your body from head to toe, and noticing which parts of your body are particularly tense. Try to relax the muscles in those areas. How will I feel when I quit smoking? Day 1 to 3 weeks Within the first 24 hours of quitting smoking, you may start to feel withdrawal symptoms. These symptoms are usually most noticeable 2-3 days after quitting, but they usually do not last for more than 2-3 weeks. You may experience these symptoms:  Mood swings.  Restlessness, anxiety, or irritability.  Trouble concentrating.  Dizziness.  Strong cravings for sugary foods and nicotine.  Mild weight gain.  Constipation.  Nausea.  Coughing or a sore throat.  Changes in how the medicines that you take for unrelated issues work in your body.  Depression.  Trouble sleeping (insomnia). Week 3 and afterward After the first 2-3 weeks of  quitting, you may start to notice more positive results, such as:  Improved sense of smell and taste.  Decreased coughing and sore throat.  Slower heart rate.  Lower blood pressure.  Clearer skin.  The ability to breathe more easily.  Fewer sick days. Quitting smoking can be very challenging. Do not get discouraged if you are not successful the first time. Some people need to make many attempts to quit before they achieve long-term success. Do your best to stick to your quit plan, and talk with your health care provider if you have any questions or concerns. Summary  Smoking tobacco is the leading cause of preventable death. Quitting smoking is one of the best things that you can do for your health.  When you decide to quit smoking, create a plan to help you succeed.  Quit smoking right away, not slowly over a period of time.  When you start quitting, seek help from your health care provider, family, or friends. This information is not intended to replace advice given to you by your health care provider. Make sure you discuss any questions you have with your health care provider. Document Revised: 02/08/2019  Document Reviewed: 08/04/2018 Elsevier Patient Education  [employer].    If you have lab work done today you will be contacted with your lab results within the next 2 weeks.  If you have not heard from [place] then please contact us. The fastest way to get your results is to register for My Chart.   IF you received an x-ray today, you will receive an invoice from [facility]. Please contact [facility] at [phone] with questions or concerns regarding your invoice.   IF you received labwork today, you will receive an invoice from [place]. Please contact [facility] at [phone] with questions or concerns regarding your invoice.   Our billing staff will not be able to assist you with questions regarding bills from these companies.  You will be  contacted with the lab results as soon as they are available. The fastest way to get your results is to activate your My Chart account. Instructions are located on the last page of this paperwork. If you have not heard from [place] regarding the results in 2 weeks, please contact this office.
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
## 2020-04-11 ENCOUNTER — Other Ambulatory Visit: Payer: Self-pay | Admitting: Family Medicine

## 2020-04-11 DIAGNOSIS — I10 Essential (primary) hypertension: Secondary | ICD-10-CM

## 2020-04-14 ENCOUNTER — Other Ambulatory Visit: Payer: Self-pay | Admitting: Family Medicine

## 2020-04-14 DIAGNOSIS — K219 Gastro-esophageal reflux disease without esophagitis: Secondary | ICD-10-CM

## 2020-04-14 MED ORDER — OMEPRAZOLE 20 MG PO CPDR: 20.0000 mg | DELAYED_RELEASE_CAPSULE | Freq: Every day | ORAL | 1 refills | Status: DC

## 2020-04-14 NOTE — Telephone Encounter (Signed)
Copied from CRM [id number]. Topic: Quick Communication - Rx Refill/Question >> Apr 14, 2020  2:53 PM Jaquita Rector A wrote: Medication: omeprazole (PRILOSEC) 20 MG capsule   Has the patient contacted their pharmacy? Yes.   (Agent: If no, request that the patient contact the pharmacy for the refill.) (Agent: If yes, when and what did the pharmacy advise?)  Preferred Pharmacy (with phone number or street name): [id number] - [place], [place] - [address]  Phone:  [phone] Fax:  [phone]     Agent: Please be advised that RX refills may take up to 3 business days. We ask that you follow-up with your pharmacy.

## 2020-04-14 NOTE — Telephone Encounter (Signed)
Requested medication (s) are due for refill today: yes  Requested medication (s) are on the active medication list: yes  Last refill:  10/12/19  Future visit scheduled: no  Notes to clinic:  last encounter addressing this issue was 1 year ago-    Requested Prescriptions  Pending Prescriptions Disp Refills   omeprazole (PRILOSEC) 20 MG capsule 90 capsule 1    Sig: Take 1 capsule (20 mg total) by mouth daily.      Gastroenterology: Proton Pump Inhibitors Passed - 04/14/2020  2:55 PM      Passed - Valid encounter within last 12 months    Recent Outpatient Visits           2 months ago Pain of right thigh   [facility], Asencion Partridge, MD   2 months ago Essential hypertension   [facility], Asencion Partridge, MD   1 year ago Essential hypertension   [facility], Asencion Partridge, MD   1 year ago Gastroesophageal reflux disease without esophagitis   [facility], Asencion Partridge, MD   2 years ago Essential hypertension   [facility], Asencion Partridge, MD

## 2020-05-12 ENCOUNTER — Telehealth: Payer: Self-pay | Admitting: Family Medicine

## 2020-05-12 NOTE — Telephone Encounter (Signed)
Received call from Marylene Land at [facility] regarding patient's referral. Wants to know if patient still needs to be seen. Date in her system is not the same date patient was originally scheduled for. Please advise at [phone]

## 2020-05-13 NOTE — Telephone Encounter (Signed)
I have called pt and and Scott Galloway back and confirmed that pt does have an appointment on March 4 @ 12:30.   Pt is not going to a different eye care center.

## 2020-06-05 ENCOUNTER — Telehealth: Payer: 59 | Admitting: Family Medicine

## 2020-06-05 ENCOUNTER — Other Ambulatory Visit: Payer: Self-pay

## 2020-07-05 ENCOUNTER — Other Ambulatory Visit: Payer: Self-pay | Admitting: Family Medicine

## 2020-07-05 DIAGNOSIS — I10 Essential (primary) hypertension: Secondary | ICD-10-CM

## 2020-07-05 NOTE — Telephone Encounter (Signed)
Requested Prescriptions  Pending Prescriptions Disp Refills  . amLODipine (NORVASC) 10 MG tablet [Pharmacy Med Name: AMLODIPINE BESYLATE 10 MG TAB] 90 tablet 0    Sig: TAKE 1 TABLET BY MOUTH EVERY DAY     Cardiovascular:  Calcium Channel Blockers Passed - 07/05/2020 12:46 AM      Passed - Last BP in normal range    BP Readings from Last 1 Encounters:  02/12/20 137/89         Passed - Valid encounter within last 6 months    Recent Outpatient Visits          4 months ago Pain of right thigh   [facility], Asencion Partridge, MD   5 months ago Essential hypertension   [facility], Asencion Partridge, MD   1 year ago Essential hypertension   [facility], Asencion Partridge, MD   1 year ago Gastroesophageal reflux disease without esophagitis   [facility], Asencion Partridge, MD   2 years ago Essential hypertension   [facility], Asencion Partridge, MD             pt will need follow up appointment

## 2020-10-04 ENCOUNTER — Other Ambulatory Visit: Payer: Self-pay | Admitting: Family Medicine

## 2020-10-04 DIAGNOSIS — I10 Essential (primary) hypertension: Secondary | ICD-10-CM

## 2020-10-16 ENCOUNTER — Other Ambulatory Visit: Payer: Self-pay | Admitting: Family Medicine

## 2020-10-16 DIAGNOSIS — K219 Gastro-esophageal reflux disease without esophagitis: Secondary | ICD-10-CM

## 2020-11-19 ENCOUNTER — Ambulatory Visit: Payer: 59 | Admitting: Family Medicine

## 2020-11-19 ENCOUNTER — Other Ambulatory Visit: Payer: Self-pay

## 2020-11-19 VITALS — BP 124/74 | HR 73 | Temp 98.2°F | Resp 17 | Ht 74.0 in | Wt 215.4 lb

## 2020-11-19 DIAGNOSIS — R9341 Abnormal radiologic findings on diagnostic imaging of renal pelvis, ureter, or bladder: Secondary | ICD-10-CM | POA: Diagnosis not present

## 2020-11-19 DIAGNOSIS — R351 Nocturia: Secondary | ICD-10-CM | POA: Diagnosis not present

## 2020-11-19 DIAGNOSIS — I10 Essential (primary) hypertension: Secondary | ICD-10-CM

## 2020-11-19 LAB — POCT URINALYSIS DIP (MANUAL ENTRY)
Bilirubin, UA: NEGATIVE
Blood, UA: NEGATIVE
Glucose, UA: NEGATIVE mg/dL
Leukocytes, UA: NEGATIVE
Nitrite, UA: NEGATIVE
Protein Ur, POC: NEGATIVE mg/dL
Spec Grav, UA: 1.025 (ref 1.010–1.025)
Urobilinogen, UA: 0.2 E.U./dL
pH, UA: 5 (ref 5.0–8.0)

## 2020-11-19 MED ORDER — AMLODIPINE BESYLATE 10 MG PO TABS: 1.0000 | ORAL_TABLET | Freq: Every day | ORAL | 0 refills | Status: DC
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
## 2020-12-25 ENCOUNTER — Ambulatory Visit: Payer: 59 | Admitting: Family Medicine

## 2021-01-09 ENCOUNTER — Other Ambulatory Visit: Payer: Self-pay | Admitting: Family Medicine

## 2021-01-09 DIAGNOSIS — K219 Gastro-esophageal reflux disease without esophagitis: Secondary | ICD-10-CM

## 2021-01-12 ENCOUNTER — Other Ambulatory Visit: Payer: Self-pay | Admitting: Family Medicine

## 2021-01-12 DIAGNOSIS — F17210 Nicotine dependence, cigarettes, uncomplicated: Secondary | ICD-10-CM

## 2021-01-15 ENCOUNTER — Ambulatory Visit: Payer: 59 | Admitting: Registered Nurse

## 2021-01-18 ENCOUNTER — Ambulatory Visit: Payer: 59 | Admitting: Registered Nurse

## 2021-01-19 ENCOUNTER — Ambulatory Visit: Payer: 59 | Admitting: Registered Nurse

## 2021-01-19 ENCOUNTER — Other Ambulatory Visit: Payer: Self-pay

## 2021-01-19 ENCOUNTER — Encounter: Payer: Self-pay | Admitting: Registered Nurse

## 2021-01-19 VITALS — BP 134/86 | HR 65 | Temp 98.0°F | Resp 17 | Wt 209.4 lb

## 2021-01-19 DIAGNOSIS — S2232XA Fracture of one rib, left side, initial encounter for closed fracture: Secondary | ICD-10-CM | POA: Diagnosis not present

## 2021-01-19 MED ORDER — CYCLOBENZAPRINE HCL 5 MG PO TABS: 5.0000 mg | ORAL_TABLET | Freq: Three times a day (TID) | ORAL | 1 refills | Status: DC | PRN
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
## 2021-01-26 ENCOUNTER — Other Ambulatory Visit: Payer: Self-pay

## 2021-01-26 ENCOUNTER — Ambulatory Visit (INDEPENDENT_AMBULATORY_CARE_PROVIDER_SITE_OTHER): Payer: 59

## 2021-01-26 ENCOUNTER — Encounter (HOSPITAL_COMMUNITY): Payer: Self-pay | Admitting: *Deleted

## 2021-01-26 ENCOUNTER — Ambulatory Visit (HOSPITAL_COMMUNITY)
Admission: EM | Admit: 2021-01-26 | Discharge: 2021-01-26 | Disposition: A | Discharge status: Home / Self Care | Payer: 59 | Attending: Internal Medicine | Admitting: Internal Medicine

## 2021-01-26 DIAGNOSIS — S2231XA Fracture of one rib, right side, initial encounter for closed fracture: Secondary | ICD-10-CM

## 2021-01-26 DIAGNOSIS — M545 Low back pain, unspecified: Secondary | ICD-10-CM

## 2021-01-26 MED ORDER — NAPROXEN 500 MG PO TABS: 500.0000 mg | ORAL_TABLET | Freq: Two times a day (BID) | ORAL | 0 refills | Status: DC

## 2021-01-26 NOTE — ED Triage Notes (Signed)
Pt reports rt sided rib pain started the last day or two. Pt also has back pain.

## 2021-01-26 NOTE — Discharge Instructions (Addendum)
You have a small fracture in your right sixth rib.  This is causing the pain that you have on your right side.  You can take the naproxen as needed to help with this as well as your muscular back pain.  Follow-up with your primary care provider over the next few weeks if you are not having improvement in your pain.  You can also see your spine surgeon if you have significant worsening in your pain.  If you develop numbness and tingling in your groin, changes in your bowel or bladder function, new weakness in 1 side of your body or the other, you should be seen in the emergency room right away.
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
## 2021-01-27 ENCOUNTER — Ambulatory Visit: Payer: 59 | Admitting: Family Medicine

## 2021-01-28 ENCOUNTER — Ambulatory Visit: Payer: 59 | Admitting: Registered Nurse

## 2021-02-24 ENCOUNTER — Encounter (HOSPITAL_COMMUNITY): Payer: Self-pay

## 2021-02-24 ENCOUNTER — Ambulatory Visit (HOSPITAL_COMMUNITY)
Admission: EM | Admit: 2021-02-24 | Discharge: 2021-02-24 | Disposition: A | Discharge status: Home / Self Care | Payer: 59 | Attending: Emergency Medicine | Admitting: Emergency Medicine

## 2021-02-24 ENCOUNTER — Other Ambulatory Visit: Payer: Self-pay

## 2021-02-24 DIAGNOSIS — M542 Cervicalgia: Secondary | ICD-10-CM

## 2021-02-24 DIAGNOSIS — M545 Low back pain, unspecified: Secondary | ICD-10-CM

## 2021-02-24 MED ORDER — METHYLPREDNISOLONE 4 MG PO TBPK: ORAL_TABLET | ORAL | 0 refills | Status: DC

## 2021-02-24 MED ORDER — KETOROLAC TROMETHAMINE 60 MG/2ML IM SOLN
INTRAMUSCULAR | Status: AC
  Filled 2021-02-24: qty 2

## 2021-02-24 MED ORDER — KETOROLAC TROMETHAMINE 60 MG/2ML IM SOLN
60.0000 mg | Freq: Once | INTRAMUSCULAR | Status: AC
  Administered 2021-02-24: 60 mg via INTRAMUSCULAR

## 2021-02-24 MED ORDER — BACLOFEN 20 MG PO TABS: 20.0000 mg | ORAL_TABLET | Freq: Every day | ORAL | 0 refills | Status: AC

## 2021-02-24 NOTE — Discharge Instructions (Addendum)
For your pain, you received an injection of ketorolac 60 mg during your visit today.  This should resolve most of your pain for roughly 6 to 8 hours.  In the meantime please go to your pharmacy to pick up your prescriptions for methylprednisolone and baclofen.  As we discussed, you cannot take baclofen at the same time is taking Flexeril so please do not take Flexeril for the next 5 days.  Take baclofen at bedtime.  If you have unresolved pain, you are welcome to return to our clinic for repeat injection of ketorolac.  Please do follow-up with your primary care provider soon as possible should you require more days to remain out of work, I have provided you with a note to remain out for the next 3 days.
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
## 2021-03-01 ENCOUNTER — Ambulatory Visit (INDEPENDENT_AMBULATORY_CARE_PROVIDER_SITE_OTHER): Payer: 59 | Admitting: Registered Nurse

## 2021-03-01 ENCOUNTER — Other Ambulatory Visit: Payer: Self-pay

## 2021-03-01 VITALS — BP 136/87 | HR 66 | Temp 98.2°F | Resp 18 | Ht 74.0 in | Wt 209.4 lb

## 2021-03-01 DIAGNOSIS — M5442 Lumbago with sciatica, left side: Secondary | ICD-10-CM | POA: Diagnosis not present

## 2021-03-01 MED ORDER — DIAZEPAM 2 MG PO TABS: 2.0000 mg | ORAL_TABLET | Freq: Two times a day (BID) | ORAL | 0 refills | Status: DC | PRN

## 2021-03-01 NOTE — Patient Instructions (Addendum)
Mr. Scott Galloway to see you - but we have to stop meeting under such circumstances.  Message out to [name]  Diazepam sent to pharmacy - use sparingly  PT referral sent  Thank you  Rich     If you have lab work done today you will be contacted with your lab results within the next 2 weeks.  If you have not heard from [place] then please contact us. The fastest way to get your results is to register for My Chart.   IF you received an x-ray today, you will receive an invoice from [facility]. Please contact [facility] at [phone] with questions or concerns regarding your invoice.   IF you received labwork today, you will receive an invoice from [place]. Please contact [facility] at [phone] with questions or concerns regarding your invoice.   Our billing staff will not be able to assist you with questions regarding bills from these companies.  You will be contacted with the lab results as soon as they are available. The fastest way to get your results is to activate your My Chart account. Instructions are located on the last page of this paperwork. If you have not heard from [place] regarding the results in 2 weeks, please contact this office.
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
## 2021-03-02 ENCOUNTER — Telehealth: Payer: Self-pay | Admitting: Family Medicine
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
## 2021-03-03 NOTE — Telephone Encounter (Signed)
Called and spoke with patient and patient stated that he feels like he is in a pickle because if he stands too long he hurts and if he sits too long it hurts and he is going to reach out to an attorney that handles workers comp cases to find a provider to take him out of work and will need a couple of days to decide if he will try the sit down job or take an alternate route. Patient will call us back and let us know how we wants to proceed.

## 2021-03-03 NOTE — Telephone Encounter (Signed)
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 I think he will be able to.   Can return as soon as next week if so  Thank you  Luan Pulling

## 2021-03-08 ENCOUNTER — Ambulatory Visit: Payer: 59 | Admitting: Rehabilitative and Restorative Service Providers"

## 2021-03-13 ENCOUNTER — Other Ambulatory Visit: Payer: Self-pay | Admitting: Family Medicine

## 2021-03-15 ENCOUNTER — Encounter: Payer: Self-pay | Admitting: Registered Nurse

## 2021-03-15 ENCOUNTER — Telehealth: Payer: Self-pay

## 2021-03-15 ENCOUNTER — Other Ambulatory Visit: Payer: Self-pay

## 2021-03-15 DIAGNOSIS — I10 Essential (primary) hypertension: Secondary | ICD-10-CM

## 2021-03-15 MED ORDER — AMLODIPINE BESYLATE 10 MG PO TABS: 10.0000 mg | ORAL_TABLET | Freq: Every day | ORAL | 0 refills | Status: DC

## 2021-03-15 NOTE — Telephone Encounter (Signed)
I have printed this, will be on my desk  Thank you  Luan Pulling

## 2021-03-15 NOTE — Telephone Encounter (Signed)
Caller name:Scott Galloway   On DPR? :yes  Call back number:[phone]  Provider they see: Janeece Agee   Reason for call:Pt seen Richard 10/03 regarding his back and referred him to orthopedic which he is unable to see this doctor until October 31st Dr Wilburt Finlay? Per workman's comp. He is unable to work until then and needs work excuse   Pt also needs refill on amLODipine (NORVASC) 10 MG tablet patient has 3 left  Last appt 10/3  No upcoming appt   [id number] [place], [place] - [address]  [address] [place] [address]

## 2021-03-15 NOTE — Telephone Encounter (Signed)
I called and spoke with patient about his concerns. I have refilled his amlodipine 10mg  and  he also needs a note for work faxed to his job. He does not go to see his Ortho Dr until 03/29/21. Patient needs to be taken out of work until 03/30/21. Once the note has been signed by you, I can fax the note to patient employer.

## 2021-03-15 NOTE — Telephone Encounter (Signed)
Acknowledged  Scott Galloway

## 2021-03-16 NOTE — Telephone Encounter (Signed)
Richard, I have faxed work note to patients employer.

## 2021-03-19 NOTE — Telephone Encounter (Signed)
Awesome, thanks!  Rich

## 2021-03-20 ENCOUNTER — Other Ambulatory Visit: Payer: Self-pay | Admitting: Family Medicine

## 2021-03-20 DIAGNOSIS — F17210 Nicotine dependence, cigarettes, uncomplicated: Secondary | ICD-10-CM

## 2021-03-21 ENCOUNTER — Other Ambulatory Visit: Payer: Self-pay | Admitting: Family Medicine

## 2021-03-21 DIAGNOSIS — I10 Essential (primary) hypertension: Secondary | ICD-10-CM

## 2021-04-01 ENCOUNTER — Other Ambulatory Visit: Payer: Self-pay | Admitting: Family Medicine

## 2021-04-08 ENCOUNTER — Other Ambulatory Visit: Payer: Self-pay | Admitting: Family Medicine

## 2021-04-08 DIAGNOSIS — K219 Gastro-esophageal reflux disease without esophagitis: Secondary | ICD-10-CM

## 2021-04-18 ENCOUNTER — Other Ambulatory Visit: Payer: Self-pay | Admitting: Family Medicine

## 2021-05-11 ENCOUNTER — Other Ambulatory Visit: Payer: Self-pay | Admitting: Family Medicine

## 2021-05-11 DIAGNOSIS — I10 Essential (primary) hypertension: Secondary | ICD-10-CM

## 2021-05-11 DIAGNOSIS — K219 Gastro-esophageal reflux disease without esophagitis: Secondary | ICD-10-CM

## 2021-05-11 DIAGNOSIS — F17210 Nicotine dependence, cigarettes, uncomplicated: Secondary | ICD-10-CM

## 2021-06-24 ENCOUNTER — Ambulatory Visit: Payer: 59 | Admitting: Registered Nurse

## 2021-06-25 ENCOUNTER — Ambulatory Visit: Payer: 59 | Admitting: Registered Nurse

## 2021-06-25 ENCOUNTER — Encounter: Payer: Self-pay | Admitting: Registered Nurse

## 2021-06-25 VITALS — BP 148/97 | HR 94 | Temp 98.3°F | Ht 74.0 in | Wt 217.6 lb

## 2021-06-25 DIAGNOSIS — M79651 Pain in right thigh: Secondary | ICD-10-CM | POA: Diagnosis not present

## 2021-06-25 DIAGNOSIS — K649 Unspecified hemorrhoids: Secondary | ICD-10-CM

## 2021-06-25 MED ORDER — DOCUSATE SODIUM 283 MG RE ENEM: 1.0000 | ENEMA | Freq: Every day | RECTAL | 2 refills | Status: DC | PRN

## 2021-06-25 MED ORDER — HYDROCORTISONE 2 % EX LOTN: 1.0000 "application " | TOPICAL_LOTION | Freq: Two times a day (BID) | CUTANEOUS | 4 refills | Status: DC | PRN

## 2021-06-25 NOTE — Patient Instructions (Addendum)
Mr. Scott Galloway to see you  I have ordered CT scan to [facility].   I will send a cream to use 1-2 time daily and enemas for hemorrhoids  Call if you need anything  Thanks,  Rich     If you have lab work done today you will be contacted with your lab results within the next 2 weeks.  If you have not heard from [place] then please contact us. The fastest way to get your results is to register for My Chart.   IF you received an x-ray today, you will receive an invoice from [facility]. Please contact [facility] at [phone] with questions or concerns regarding your invoice.   IF you received labwork today, you will receive an invoice from [facility]. Please contact [facility] at [phone] with questions or concerns regarding your invoice.   Our billing staff will not be able to assist you with questions regarding bills from these companies.  You will be contacted with the lab results as soon as they are available. The fastest way to get your results is to activate your My Chart account. Instructions are located on the last page of this paperwork. If you have not heard from [place] regarding the results in 2 weeks, please contact this office.
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
## 2021-07-01 ENCOUNTER — Other Ambulatory Visit: Payer: Self-pay | Admitting: Registered Nurse

## 2021-07-01 DIAGNOSIS — M79651 Pain in right thigh: Secondary | ICD-10-CM

## 2021-07-15 ENCOUNTER — Other Ambulatory Visit: Payer: Self-pay | Admitting: Family Medicine

## 2021-07-15 DIAGNOSIS — F17210 Nicotine dependence, cigarettes, uncomplicated: Secondary | ICD-10-CM

## 2021-09-09 ENCOUNTER — Other Ambulatory Visit: Payer: Self-pay | Admitting: Family Medicine

## 2021-09-09 DIAGNOSIS — I10 Essential (primary) hypertension: Secondary | ICD-10-CM

## 2021-11-01 ENCOUNTER — Ambulatory Visit (INDEPENDENT_AMBULATORY_CARE_PROVIDER_SITE_OTHER): Payer: Self-pay | Admitting: Family Medicine

## 2021-11-01 VITALS — BP 122/68 | HR 89 | Temp 98.2°F | Resp 16 | Ht 74.0 in | Wt 218.7 lb

## 2021-11-01 DIAGNOSIS — F17210 Nicotine dependence, cigarettes, uncomplicated: Secondary | ICD-10-CM

## 2021-11-01 DIAGNOSIS — I10 Essential (primary) hypertension: Secondary | ICD-10-CM

## 2021-11-01 DIAGNOSIS — R053 Chronic cough: Secondary | ICD-10-CM

## 2021-11-01 DIAGNOSIS — R7303 Prediabetes: Secondary | ICD-10-CM

## 2021-11-01 DIAGNOSIS — R350 Frequency of micturition: Secondary | ICD-10-CM

## 2021-11-01 LAB — GLUCOSE, POCT (MANUAL RESULT ENTRY): POC Glucose: 97 mg/dl (ref 70–99)
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
## 2021-11-01 NOTE — Patient Instructions (Addendum)
Wear hearing protection with loud noise. Please have blood work done at the [facility] as soon as possible.  Blood sugar today looked okay. [facility] in 8:30-4:30 during weekdays, no appointment needed [address]  [place], [place] [address] No change in amlodipine dose for now.  If any new or worsening cough be seen right away.  We will discuss this further next visit and likely x-ray at that time.  You may also need to meet with pulmonary but we can discuss that further at that time.  If any new or worsening symptoms please be seen prior to your next visit in 1 month.  Take care

## 2021-12-13 ENCOUNTER — Other Ambulatory Visit: Payer: Self-pay | Admitting: Family Medicine

## 2021-12-13 DIAGNOSIS — I10 Essential (primary) hypertension: Secondary | ICD-10-CM

## 2022-03-13 ENCOUNTER — Other Ambulatory Visit: Payer: Self-pay | Admitting: Family Medicine

## 2022-03-13 DIAGNOSIS — I10 Essential (primary) hypertension: Secondary | ICD-10-CM

## 2022-04-18 ENCOUNTER — Encounter: Payer: 59 | Admitting: Family Medicine

## 2022-06-08 ENCOUNTER — Encounter: Payer: Self-pay | Admitting: Family Medicine

## 2022-06-08 ENCOUNTER — Ambulatory Visit (INDEPENDENT_AMBULATORY_CARE_PROVIDER_SITE_OTHER): Payer: BC Managed Care – PPO | Admitting: Family Medicine

## 2022-06-08 VITALS — BP 122/74 | HR 71 | Temp 97.5°F | Ht 74.0 in | Wt 212.6 lb

## 2022-06-08 DIAGNOSIS — F17210 Nicotine dependence, cigarettes, uncomplicated: Secondary | ICD-10-CM

## 2022-06-08 DIAGNOSIS — G8929 Other chronic pain: Secondary | ICD-10-CM

## 2022-06-08 DIAGNOSIS — M545 Low back pain, unspecified: Secondary | ICD-10-CM | POA: Diagnosis not present

## 2022-06-08 MED ORDER — VARENICLINE TARTRATE 1 MG PO TABS: 1.0000 mg | ORAL_TABLET | Freq: Two times a day (BID) | ORAL | 2 refills | Status: DC

## 2022-06-08 MED ORDER — VARENICLINE TARTRATE (STARTER) 0.5 MG X 11 & 1 MG X 42 PO TBPK: ORAL_TABLET | ORAL | 0 refills | Status: DC
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
## 2022-06-08 NOTE — Patient Instructions (Signed)

## 2022-06-30 NOTE — Progress Notes (Signed)
This encounter was created in error - please disregard.

## 2022-07-12 ENCOUNTER — Other Ambulatory Visit: Payer: Self-pay | Admitting: Family Medicine

## 2022-07-12 ENCOUNTER — Telehealth: Payer: Self-pay | Admitting: Family Medicine

## 2022-07-12 DIAGNOSIS — K219 Gastro-esophageal reflux disease without esophagitis: Secondary | ICD-10-CM

## 2022-07-12 MED ORDER — OMEPRAZOLE 20 MG PO CPDR: DELAYED_RELEASE_CAPSULE | ORAL | 3 refills | Status: DC

## 2022-07-12 NOTE — Addendum Note (Signed)
Addended by: Patrcia Dolly on: 07/12/2022 12:10 PM   Modules accepted: Orders

## 2022-07-12 NOTE — Telephone Encounter (Signed)
Pt informed

## 2022-07-12 NOTE — Telephone Encounter (Signed)
Encourage patient to contact the pharmacy for refills or they can request refills through [facility]  (Please schedule appointment if patient has not been seen in over a year)    WHAT PHARMACY WOULD THEY [facility] [address] [place], [place]  MEDICATION NAME & DOSE:  omeprazole (PRILOSEC) 20 MG  NOTES/COMMENTS FROM PATIENT:      [place] office please notify patient: It takes 48-72 hours to process rx refill requests Ask patient to call pharmacy to ensure rx is ready before heading there.

## 2022-07-13 ENCOUNTER — Telehealth: Payer: Self-pay | Admitting: Family Medicine

## 2022-07-13 DIAGNOSIS — I10 Essential (primary) hypertension: Secondary | ICD-10-CM

## 2022-07-13 NOTE — Telephone Encounter (Signed)
Caller name: CUAHUTEMOC TUMEY  On DPR?: Yes  Call back number: [phone] (mobile)  Provider they see: Wendie Agreste, MD  Reason for call:  Pt is calling in stating that his dentist would like to see if he can be put on a different kind of blood pressure medication due to the amlodipine 10 MG is making his gums well.  Pt would like to have a call back.

## 2022-07-14 NOTE — Telephone Encounter (Signed)
Pt dentist asking about getting new BP med, please advise patient should have a visit to discuss this?

## 2022-07-15 NOTE — Telephone Encounter (Signed)
Noted.  It appears he has taken hydrochlorothiazide in the past.  Did he have any issues with that medication?  If not, we could try that temporarily in place of the amlodipine with close monitoring of blood pressure and follow-up in office within a week or 2 of starting.  Let me know.

## 2022-07-18 NOTE — Telephone Encounter (Signed)
Pt does not recall issues with HCTZ and would [facility] [address] will start tomorrow

## 2022-07-19 MED ORDER — HYDROCHLOROTHIAZIDE 12.5 MG PO TABS: 12.5000 mg | ORAL_TABLET | Freq: Every day | ORAL | 1 refills | Status: DC

## 2022-07-19 NOTE — Telephone Encounter (Signed)
Called and LM for pt to call back and discuss

## 2022-07-19 NOTE — Telephone Encounter (Signed)
Patient called back to speak to [facility].

## 2022-07-19 NOTE — Addendum Note (Signed)
Addended by: Merri Ray R on: 07/19/2022 10:14 AM   Modules accepted: Orders

## 2022-07-19 NOTE — Telephone Encounter (Signed)
Noted. Rx sent for 12.5 mg hydrochlorothiazide.  Stop amlodipine.  Monitor blood pressure daily and if it is above 140/90, take 2 of the hydrochlorothiazide.  Follow-up in office in the next 2 weeks to recheck blood pressure on that new med as well as electrolytes.  Sooner if any new symptoms.  Let me know if there are questions.

## 2022-07-19 NOTE — Telephone Encounter (Signed)
LM again.

## 2022-07-20 NOTE — Telephone Encounter (Signed)
Pt made aware and will call back for appt

## 2022-08-16 ENCOUNTER — Other Ambulatory Visit: Payer: Self-pay | Admitting: Family Medicine

## 2022-08-16 DIAGNOSIS — I10 Essential (primary) hypertension: Secondary | ICD-10-CM

## 2022-09-03 ENCOUNTER — Other Ambulatory Visit: Payer: Self-pay | Admitting: Family Medicine

## 2022-09-03 DIAGNOSIS — I10 Essential (primary) hypertension: Secondary | ICD-10-CM

## 2022-09-03 DIAGNOSIS — F17210 Nicotine dependence, cigarettes, uncomplicated: Secondary | ICD-10-CM

## 2022-10-28 ENCOUNTER — Telehealth: Payer: Self-pay | Admitting: Family Medicine

## 2022-10-28 DIAGNOSIS — M545 Low back pain, unspecified: Secondary | ICD-10-CM | POA: Diagnosis not present

## 2022-10-28 DIAGNOSIS — K219 Gastro-esophageal reflux disease without esophagitis: Secondary | ICD-10-CM | POA: Diagnosis not present

## 2022-10-28 DIAGNOSIS — Z79899 Other long term (current) drug therapy: Secondary | ICD-10-CM | POA: Diagnosis not present

## 2022-10-28 DIAGNOSIS — M25561 Pain in right knee: Secondary | ICD-10-CM | POA: Diagnosis not present

## 2022-10-28 DIAGNOSIS — G894 Chronic pain syndrome: Secondary | ICD-10-CM | POA: Diagnosis not present

## 2022-10-28 DIAGNOSIS — Z79891 Long term (current) use of opiate analgesic: Secondary | ICD-10-CM | POA: Diagnosis not present

## 2022-10-28 NOTE — Telephone Encounter (Signed)
Pt called back and will be calling [employer] and update me ASAP.

## 2022-10-28 NOTE — Telephone Encounter (Signed)
Called Mr Laural Benes and left him a message.

## 2022-10-28 NOTE — Telephone Encounter (Signed)
Caller name: TROOPER LANGREHR  On DPR?: Yes  Call back number: [phone] (mobile)  Provider they see: Shade Flood, MD  Reason for call:   The referred pain clinic does not take his [employer]BS which is costing him out of pocket. Can we find a clinic that takes [employer].

## 2022-10-28 NOTE — Telephone Encounter (Signed)
Mr Laural Benes called back and will call his [employer] and call me back.

## 2022-11-04 ENCOUNTER — Telehealth: Payer: Self-pay

## 2022-11-04 NOTE — Telephone Encounter (Signed)
Pt called and stated we placed a referral for pain mana gent but the clinic we referred him to does not take his insurance . He is asking if we can put a referral in to [facility] ?

## 2022-11-04 NOTE — Telephone Encounter (Signed)
New referral needed or can we just change location and resubmit?

## 2022-11-25 DIAGNOSIS — G894 Chronic pain syndrome: Secondary | ICD-10-CM | POA: Diagnosis not present

## 2022-11-25 DIAGNOSIS — Z79891 Long term (current) use of opiate analgesic: Secondary | ICD-10-CM | POA: Diagnosis not present

## 2022-11-25 DIAGNOSIS — M25561 Pain in right knee: Secondary | ICD-10-CM | POA: Diagnosis not present

## 2022-11-25 DIAGNOSIS — K219 Gastro-esophageal reflux disease without esophagitis: Secondary | ICD-10-CM | POA: Diagnosis not present

## 2022-11-25 DIAGNOSIS — Z79899 Other long term (current) drug therapy: Secondary | ICD-10-CM | POA: Diagnosis not present

## 2022-11-25 DIAGNOSIS — M545 Low back pain, unspecified: Secondary | ICD-10-CM | POA: Diagnosis not present

## 2022-12-03 ENCOUNTER — Ambulatory Visit (HOSPITAL_COMMUNITY)
Admission: EM | Admit: 2022-12-03 | Discharge: 2022-12-03 | Disposition: A | Discharge status: Home / Self Care | Payer: BC Managed Care – PPO | Attending: Emergency Medicine | Admitting: Emergency Medicine

## 2022-12-03 ENCOUNTER — Encounter (HOSPITAL_COMMUNITY): Payer: Self-pay

## 2022-12-03 DIAGNOSIS — B349 Viral infection, unspecified: Secondary | ICD-10-CM

## 2022-12-03 DIAGNOSIS — R5383 Other fatigue: Secondary | ICD-10-CM

## 2022-12-03 DIAGNOSIS — Z1152 Encounter for screening for COVID-19: Secondary | ICD-10-CM | POA: Insufficient documentation

## 2022-12-03 NOTE — Discharge Instructions (Addendum)
Your blood pressure is 128/87 on recheck, I do not believe hypertension is causing your symptoms.  Your symptoms appear consistent with a viral illness, please rest, drink at least 64 ounces of water daily, you can alternate between 500 mg of Tylenol and 800 mg of ibuprofen every 4-6 hours for any body aches or discomfort or fevers.   We have swabbed you for COVID-19 today and we will contact you if your results are positive.  Please seek immediate care if you develop vision loss, weakness, chest pain, shortness of breath, or any new concerning symptoms.

## 2022-12-03 NOTE — ED Triage Notes (Signed)
Pt reports sluggish, tired, and fatigue x 4 days.
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
## 2022-12-04 LAB — SARS CORONAVIRUS 2 (TAT 6-24 HRS): SARS Coronavirus 2: NEGATIVE

## 2022-12-05 ENCOUNTER — Other Ambulatory Visit: Payer: Self-pay | Admitting: Family Medicine

## 2022-12-05 DIAGNOSIS — F17210 Nicotine dependence, cigarettes, uncomplicated: Secondary | ICD-10-CM

## 2023-01-24 ENCOUNTER — Other Ambulatory Visit: Payer: Self-pay | Admitting: Family Medicine

## 2023-01-24 DIAGNOSIS — M25561 Pain in right knee: Secondary | ICD-10-CM | POA: Diagnosis not present

## 2023-01-24 DIAGNOSIS — Z79891 Long term (current) use of opiate analgesic: Secondary | ICD-10-CM | POA: Diagnosis not present

## 2023-01-24 DIAGNOSIS — G894 Chronic pain syndrome: Secondary | ICD-10-CM | POA: Diagnosis not present

## 2023-01-24 DIAGNOSIS — I10 Essential (primary) hypertension: Secondary | ICD-10-CM

## 2023-01-24 DIAGNOSIS — K219 Gastro-esophageal reflux disease without esophagitis: Secondary | ICD-10-CM | POA: Diagnosis not present

## 2023-01-24 DIAGNOSIS — M545 Low back pain, unspecified: Secondary | ICD-10-CM | POA: Diagnosis not present

## 2023-02-13 ENCOUNTER — Other Ambulatory Visit: Payer: Self-pay | Admitting: Family Medicine

## 2023-02-13 DIAGNOSIS — F17210 Nicotine dependence, cigarettes, uncomplicated: Secondary | ICD-10-CM

## 2023-03-07 ENCOUNTER — Other Ambulatory Visit: Payer: Self-pay

## 2023-03-07 ENCOUNTER — Telehealth: Payer: Self-pay | Admitting: Family Medicine

## 2023-03-07 DIAGNOSIS — F17210 Nicotine dependence, cigarettes, uncomplicated: Secondary | ICD-10-CM

## 2023-03-07 MED ORDER — VARENICLINE TARTRATE 1 MG PO TABS
1.0000 mg | ORAL_TABLET | Freq: Two times a day (BID) | ORAL | 0 refills | Status: DC
Start: 2023-03-07 — End: 2023-04-19

## 2023-03-07 NOTE — Telephone Encounter (Signed)
Encourage patient to contact the pharmacy for refills or they can request refills through [facility]   WHAT PHARMACY WOULD THEY LIKE THIS SENT TO:  [id number] [place] [place] - [address]    MEDICATION NAME & DOSE: varenicline (CHANTIX CONTINUING MONTH PAK) 1 MG tablet  NOTES/COMMENTS FROM PATIENT:      Front office please notify patient: It takes 48-72 hours to process rx refill requests Ask patient to call pharmacy to ensure rx is ready before heading there.

## 2023-03-07 NOTE — Telephone Encounter (Signed)
Sent chantix to pharmacy, patient is aware.

## 2023-03-15 ENCOUNTER — Encounter: Payer: Self-pay | Admitting: Family Medicine

## 2023-03-15 ENCOUNTER — Ambulatory Visit (INDEPENDENT_AMBULATORY_CARE_PROVIDER_SITE_OTHER): Payer: BC Managed Care – PPO | Admitting: Family Medicine

## 2023-03-15 VITALS — BP 138/92 | HR 69 | Temp 98.4°F | Wt 219.4 lb

## 2023-03-15 DIAGNOSIS — I10 Essential (primary) hypertension: Secondary | ICD-10-CM | POA: Diagnosis not present

## 2023-03-15 DIAGNOSIS — R7303 Prediabetes: Secondary | ICD-10-CM

## 2023-03-15 DIAGNOSIS — F17210 Nicotine dependence, cigarettes, uncomplicated: Secondary | ICD-10-CM | POA: Diagnosis not present

## 2023-03-15 DIAGNOSIS — R350 Frequency of micturition: Secondary | ICD-10-CM

## 2023-03-15 MED ORDER — LOSARTAN POTASSIUM 25 MG PO TABS
25.0000 mg | ORAL_TABLET | Freq: Every day | ORAL | 1 refills | Status: DC
Start: 2023-03-15 — End: 2023-05-01
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
## 2023-03-15 NOTE — Patient Instructions (Addendum)
Keep a record of your blood pressures outside of the office and bring them to the next office visit with your meter.  If readings over 140/90 at home - add losartan once per day (printed). Continue current blood pressure pill daily.   Continue chantix for now.   As we discussed there are many reasons for frequent urination, and your blood pressure medicine may be contributing.   I will check your blood sugar as that sometimes can cause increased urination as well.  If no source found on your labs and you are still having symptoms next visit we can do urine testing or prostate testing at that time if needed.  If any new or worsening symptoms prior to next visit please be seen sooner.  Take care!     Managing the Challenge of Quitting Smoking Quitting smoking is a physical and mental challenge. You may have cravings, withdrawal symptoms, and temptation to smoke. Before quitting, work with your health care provider to make a plan that can help you manage quitting. Making a plan before you quit may keep you from smoking when you have the urge to smoke while trying to quit. How to manage lifestyle changes Managing stress Stress can make you want to smoke, and wanting to smoke may cause stress. It is important to find ways to manage your stress. You could try some of the following: Practice relaxation techniques. Breathe slowly and deeply, in through your nose and out through your mouth. Listen to music. Soak in a bath or take a shower. Imagine a peaceful place or vacation. Get some support. Talk with family or friends about your stress. Join a support group. Talk with a counselor or therapist. Get some physical activity. Go for a walk, run, or bike ride. Play a favorite sport. Practice yoga.  Medicines Talk with your health care provider about medicines that might help you deal with cravings and make quitting easier for you. Relationships Social situations can be difficult when you are  quitting smoking. To manage this, you can: Avoid parties and other social situations where people might be smoking. Avoid alcohol. Leave right away if you have the urge to smoke. Explain to your family and friends that you are quitting smoking. Ask for support and let them know you might be a bit grumpy. Plan activities where smoking is not an option. General instructions Be aware that many people gain weight after they quit smoking. However, not everyone does. To keep from gaining weight, have a plan in place before you quit, and stick to the plan after you quit. Your plan should include: Eating healthy snacks. When you have a craving, it may help to: Eat popcorn, or try carrots, celery, or other cut vegetables. Chew sugar-free gum. Changing how you eat. Eat small portion sizes at meals. Eat 4-6 small meals throughout the day instead of 1-2 large meals a day. Be mindful when you eat. You should avoid watching television or doing other things that might distract you as you eat. Exercising regularly. Make time to exercise each day. If you do not have time for a long workout, do short bouts of exercise for 5-10 minutes several times a day. Do some form of strengthening exercise, such as weight lifting. Do some exercise that gets your heart beating and causes you to breathe deeply, such as walking fast, running, swimming, or biking. This is very important. Drinking plenty of water or other low-calorie or no-calorie drinks. Drink enough fluid to keep your urine  pale yellow.  How to recognize withdrawal symptoms Your body and mind may experience discomfort as you try to get used to not having nicotine in your system. These effects are called withdrawal symptoms. They may include: Feeling hungrier than normal. Having trouble concentrating. Feeling irritable or restless. Having trouble sleeping. Feeling depressed. Craving a cigarette. These symptoms may surprise you, but they are normal to have  when quitting smoking. To manage withdrawal symptoms: Avoid places, people, and activities that trigger your cravings. Remember why you want to quit. Get plenty of sleep. Avoid coffee and other drinks that contain caffeine. These may worsen some of your symptoms. How to manage cravings Come up with a plan for how to deal with your cravings. The plan should include the following: A definition of the specific situation you want to deal with. An activity or action you will take to replace smoking. A clear idea for how this action will help. The name of someone who could help you with this. Cravings usually last for 5-10 minutes. Consider taking the following actions to help you with your plan to deal with cravings: Keep your mouth busy. Chew sugar-free gum. Suck on hard candies or a straw. Brush your teeth. Keep your hands and body busy. Change to a different activity right away. Squeeze or play with a ball. Do an activity or a hobby, such as making bead jewelry, practicing needlepoint, or working with wood. Mix up your normal routine. Take a short exercise break. Go for a quick walk, or run up and down stairs. Focus on doing something kind or helpful for someone else. Call a friend or family member to talk during a craving. Join a support group. Contact a quitline. Where to find support To get help or find a support group: Call the National Cancer Institute's Smoking Quitline: 1-800-QUIT-NOW (972) 478-9431) Text QUIT to SmokefreeTXT: 147829 Where to find more information Visit these websites to find more information on quitting smoking: U.S. Department of Health and Human Services: www.smokefree.gov American Lung Association: www.freedomfromsmoking.org Centers for Disease Control and Prevention (CDC): FootballExhibition.com.br American Heart Association: www.heart.org Contact a health care provider if: You want to change your plan for quitting. The medicines you are taking are not helping. Your  eating feels out of control or you cannot sleep. You feel depressed or become very anxious. Summary Quitting smoking is a physical and mental challenge. You will face cravings, withdrawal symptoms, and temptation to smoke again. Preparation can help you as you go through these challenges. Try different techniques to manage stress, handle social situations, and prevent weight gain. You can deal with cravings by keeping your mouth busy (such as by chewing gum), keeping your hands and body busy, calling family or friends, or contacting a quitline for people who want to quit smoking. You can deal with withdrawal symptoms by avoiding places where people smoke, getting plenty of rest, and avoiding drinks that contain caffeine. This information is not intended to replace advice given to you by your health care provider. Make sure you discuss any questions you have with your health care provider. Document Revised: 05/07/2021 Document Reviewed: 05/07/2021 Elsevier Patient Education  2024 [employer].

## 2023-03-16 LAB — COMPREHENSIVE METABOLIC PANEL
ALT: 13 U/L (ref 0–53)
AST: 19 U/L (ref 0–37)
Albumin: 4.2 g/dL (ref 3.5–5.2)
Alkaline Phosphatase: 75 U/L (ref 39–117)
BUN: 13 mg/dL (ref 6–23)
CO2: 29 meq/L (ref 19–32)
Calcium: 9.9 mg/dL (ref 8.4–10.5)
Chloride: 101 meq/L (ref 96–112)
Creatinine, Ser: 1.08 mg/dL (ref 0.40–1.50)
GFR: 78.57 mL/min (ref >60.00)
Glucose, Bld: 76 mg/dL (ref 70–99)
Potassium: 4.2 meq/L (ref 3.5–5.1)
Sodium: 138 meq/L (ref 135–145)
Total Bilirubin: 0.4 mg/dL (ref 0.2–1.2)
Total Protein: 7.6 g/dL (ref 6.0–8.3)

## 2023-03-16 LAB — LIPID PANEL
Cholesterol: 244 mg/dL — ABNORMAL HIGH (ref 0–200)
HDL: 47.3 mg/dL (ref >39.00)
LDL Cholesterol: 160 mg/dL — ABNORMAL HIGH (ref 0–99)
NonHDL: 197.18
Total CHOL/HDL Ratio: 5
Triglycerides: 187 mg/dL — ABNORMAL HIGH (ref 0.0–149.0)
VLDL: 37.4 mg/dL (ref 0.0–40.0)

## 2023-03-16 LAB — HEMOGLOBIN A1C: Hgb A1c MFr Bld: 6.1 % (ref 4.6–6.5)

## 2023-03-22 ENCOUNTER — Telehealth: Payer: Self-pay

## 2023-03-22 DIAGNOSIS — E785 Hyperlipidemia, unspecified: Secondary | ICD-10-CM

## 2023-03-22 NOTE — Telephone Encounter (Signed)
-----   Message from Shade Flood sent at 03/21/2023  5:45 PM EDT ----- MyChart message sent but does not look like he has accessed that recently.  Call and make sure he receives these results.  Thanks     The 10-year ASCVD risk score (Arnett DK, et al., 2019) is: 20.5%   Values used to calculate the score:     Age: 52 years     Sex: Male     Is Non-Hispanic African American: Yes     Diabetic: No     Tobacco smoker: Yes     Systolic Blood Pressure: 138 mmHg     Is BP treated: Yes     HDL Cholesterol: 47.3 mg/dL     Total Cholesterol: 244 mg/dL

## 2023-03-23 MED ORDER — ATORVASTATIN CALCIUM 10 MG PO TABS
10.0000 mg | ORAL_TABLET | Freq: Every day | ORAL | 0 refills | Status: DC
Start: 2023-03-23 — End: 2023-05-01

## 2023-03-23 NOTE — Telephone Encounter (Signed)
Noted, Lipitor sent to his pharmacy.  Please schedule lab visit in 6 weeks, orders have been placed.  If he has any new muscle aches, or side effects of that medication let me know.

## 2023-03-23 NOTE — Addendum Note (Signed)
Addended by: Meredith Staggers R on: 03/23/2023 01:09 PM   Modules accepted: Orders

## 2023-03-23 NOTE — Telephone Encounter (Signed)
Pt has been made aware med was sent and notes will schedule lab only appt when he is here next week

## 2023-03-23 NOTE — Telephone Encounter (Signed)
Pt has been notified Pt states he will start medication Please advise

## 2023-03-28 DIAGNOSIS — Z5181 Encounter for therapeutic drug level monitoring: Secondary | ICD-10-CM | POA: Diagnosis not present

## 2023-03-28 DIAGNOSIS — G894 Chronic pain syndrome: Secondary | ICD-10-CM | POA: Diagnosis not present

## 2023-03-28 DIAGNOSIS — Z79891 Long term (current) use of opiate analgesic: Secondary | ICD-10-CM | POA: Diagnosis not present

## 2023-03-28 DIAGNOSIS — M5416 Radiculopathy, lumbar region: Secondary | ICD-10-CM | POA: Diagnosis not present

## 2023-03-29 ENCOUNTER — Ambulatory Visit: Payer: BC Managed Care – PPO | Admitting: Family Medicine

## 2023-03-29 VITALS — BP 130/82 | HR 79 | Temp 98.3°F | Ht 74.0 in | Wt 217.0 lb

## 2023-03-29 DIAGNOSIS — K219 Gastro-esophageal reflux disease without esophagitis: Secondary | ICD-10-CM | POA: Diagnosis not present

## 2023-03-29 DIAGNOSIS — Z8042 Family history of malignant neoplasm of prostate: Secondary | ICD-10-CM

## 2023-03-29 DIAGNOSIS — R350 Frequency of micturition: Secondary | ICD-10-CM

## 2023-03-29 DIAGNOSIS — Z0000 Encounter for general adult medical examination without abnormal findings: Secondary | ICD-10-CM | POA: Diagnosis not present

## 2023-03-29 DIAGNOSIS — I10 Essential (primary) hypertension: Secondary | ICD-10-CM

## 2023-03-29 DIAGNOSIS — E785 Hyperlipidemia, unspecified: Secondary | ICD-10-CM

## 2023-03-29 DIAGNOSIS — Z125 Encounter for screening for malignant neoplasm of prostate: Secondary | ICD-10-CM | POA: Diagnosis not present

## 2023-03-29 LAB — POCT URINALYSIS DIP (MANUAL ENTRY)
Bilirubin, UA: NEGATIVE
Blood, UA: NEGATIVE
Glucose, UA: NEGATIVE mg/dL
Ketones, POC UA: NEGATIVE mg/dL
Nitrite, UA: NEGATIVE
Protein Ur, POC: NEGATIVE mg/dL
Spec Grav, UA: >=1.03 — AB (ref 1.010–1.025)
Urobilinogen, UA: NEGATIVE U/dL — AB
pH, UA: 5.5 (ref 5.0–8.0)

## 2023-03-29 MED ORDER — OMEPRAZOLE 20 MG PO CPDR: 20.0000 mg | DELAYED_RELEASE_CAPSULE | Freq: Every day | ORAL | 3 refills | Status: DC
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
## 2023-03-29 NOTE — Patient Instructions (Addendum)
Add the losartan 25mg  once per day. I will check urine test and prostate test today. Recheck in 1 month and decide then if other blood pressure med changes needed with frequent urination.  I recommend avoiding more than 1-2 alcoholic drinks per day.  Start low, go slow with exercise.  Ultimate goal of 150 minutes/week. If any concerns on the prostate test and urine test today I will let you know  Follow-up with me in 1 month.  Sooner if any new or worsening symptoms.  Preventive Care 53-82 Years Old, Male Preventive care refers to lifestyle choices and visits with your health care provider that can promote health and wellness. Preventive care visits are also called wellness exams. What can I expect for my preventive care visit? Counseling During your preventive care visit, your health care provider may ask about your: Medical history, including: Past medical problems. Family medical history. Current health, including: Emotional well-being. Home life and relationship well-being. Sexual activity. Lifestyle, including: Alcohol, nicotine or tobacco, and drug use. Access to firearms. Diet, exercise, and sleep habits. Safety issues such as seatbelt and bike helmet use. Sunscreen use. Work and work Astronomer. Physical exam Your health care provider will check your: Height and weight. These may be used to calculate your BMI (body mass index). BMI is a measurement that tells if you are at a healthy weight. Waist circumference. This measures the distance around your waistline. This measurement also tells if you are at a healthy weight and may help predict your risk of certain diseases, such as type 2 diabetes and high blood pressure. Heart rate and blood pressure. Body temperature. Skin for abnormal spots. What immunizations do I need?  Vaccines are usually given at various ages, according to a schedule. Your health care provider will recommend vaccines for you based on your age, medical  history, and lifestyle or other factors, such as travel or where you work. What tests do I need? Screening Your health care provider may recommend screening tests for certain conditions. This may include: Lipid and cholesterol levels. Diabetes screening. This is done by checking your blood sugar (glucose) after you have not eaten for a while (fasting). Hepatitis B test. Hepatitis C test. HIV (human immunodeficiency virus) test. STI (sexually transmitted infection) testing, if you are at risk. Lung cancer screening. Prostate cancer screening. Colorectal cancer screening. Talk with your health care provider about your test results, treatment options, and if necessary, the need for more tests. Follow these instructions at home: Eating and drinking  Eat a diet that includes fresh fruits and vegetables, whole grains, lean protein, and low-fat dairy products. Take vitamin and mineral supplements as recommended by your health care provider. Do not drink alcohol if your health care provider tells you not to drink. If you drink alcohol: Limit how much you have to 0-2 drinks a day. Know how much alcohol is in your drink. In the [place] one drink equals one 12 oz bottle of beer (355 mL), one 5 oz glass of wine (148 mL), or one 1 oz glass of hard liquor (44 mL). Lifestyle Brush your teeth every morning and night with fluoride toothpaste. Floss one time each day. Exercise for at least 30 minutes 5 or more days each week. Do not use any products that contain nicotine or tobacco. These products include cigarettes, chewing tobacco, and vaping devices, such as e-cigarettes. If you need help quitting, ask your health care provider. Do not use drugs. If you are sexually active, practice safe sex.  Use a condom or other form of protection to prevent STIs. Take aspirin only as told by your health care provider. Make sure that you understand how much to take and what form to take. Work with your health care  provider to find out whether it is safe and beneficial for you to take aspirin daily. Find healthy ways to manage stress, such as: Meditation, yoga, or listening to music. Journaling. Talking to a trusted person. Spending time with friends and family. Minimize exposure to UV radiation to reduce your risk of skin cancer. Safety Always wear your seat belt while driving or riding in a vehicle. Do not drive: If you have been drinking alcohol. Do not ride with someone who has been drinking. When you are tired or distracted. While texting. If you have been using any mind-altering substances or drugs. Wear a helmet and other protective equipment during sports activities. If you have firearms in your house, make sure you follow all gun safety procedures. What's next? Go to your health care provider once a year for an annual wellness visit. Ask your health care provider how often you should have your eyes and teeth checked. Stay up to date on all vaccines. This information is not intended to replace advice given to you by your health care provider. Make sure you discuss any questions you have with your health care provider. Document Revised: 11/11/2020 Document Reviewed: 11/11/2020 Elsevier Patient Education  2024 [employer].

## 2023-03-30 LAB — PSA: PSA: 2.12 ng/mL (ref 0.10–4.00)

## 2023-03-31 ENCOUNTER — Encounter: Payer: Self-pay | Admitting: Family Medicine

## 2023-04-06 ENCOUNTER — Telehealth: Payer: Self-pay

## 2023-04-06 NOTE — Telephone Encounter (Signed)
-----   Message from Shade Flood sent at 04/06/2023 11:18 AM EST ----- Results sent by MyChart, please call and check status of urinary symptoms.

## 2023-04-07 NOTE — Telephone Encounter (Signed)
No recent change in urinary frequency. Still 3 times per night. No dysuria, no penile d/c. Ongoing symptoms for years, no recent changes.  He prefer not to take any new medications at this time.  Plan for lab visit next week for point-of-care urinalysis and urine culture for urinary frequency.  Depending on those results and then could consider either trial of antibiotic versus urology eval.  Please schedule lab visit with lab orders above.

## 2023-04-10 ENCOUNTER — Telehealth: Payer: Self-pay | Admitting: Family Medicine

## 2023-04-10 NOTE — Telephone Encounter (Signed)
LM to call back and make nurse visit for urine sample

## 2023-04-10 NOTE — Telephone Encounter (Signed)
Pt has been made aware appt made

## 2023-04-10 NOTE — Telephone Encounter (Signed)
Caller name: RABIH PASQUAL  On DPR?: Yes  Call back number: [phone] (mobile)  Provider they see: Shade Flood, MD  Reason for call:   Returned Mackenzie's call

## 2023-04-18 ENCOUNTER — Other Ambulatory Visit: Payer: Self-pay | Admitting: Family Medicine

## 2023-04-18 DIAGNOSIS — F17210 Nicotine dependence, cigarettes, uncomplicated: Secondary | ICD-10-CM

## 2023-05-01 ENCOUNTER — Encounter: Payer: Self-pay | Admitting: Family Medicine

## 2023-05-01 ENCOUNTER — Ambulatory Visit (INDEPENDENT_AMBULATORY_CARE_PROVIDER_SITE_OTHER): Payer: BC Managed Care – PPO | Admitting: Family Medicine

## 2023-05-01 VITALS — BP 132/78 | HR 78 | Temp 98.2°F | Ht 74.0 in | Wt 219.4 lb

## 2023-05-01 DIAGNOSIS — E785 Hyperlipidemia, unspecified: Secondary | ICD-10-CM

## 2023-05-01 DIAGNOSIS — I10 Essential (primary) hypertension: Secondary | ICD-10-CM | POA: Diagnosis not present

## 2023-05-01 DIAGNOSIS — R350 Frequency of micturition: Secondary | ICD-10-CM | POA: Diagnosis not present

## 2023-05-01 LAB — POCT URINALYSIS DIPSTICK
Bilirubin, UA: NEGATIVE
Blood, UA: NEGATIVE
Glucose, UA: NEGATIVE
Ketones, UA: NEGATIVE
Leukocytes, UA: NEGATIVE
Nitrite, UA: NEGATIVE
Protein, UA: NEGATIVE
Spec Grav, UA: 1.02 (ref 1.010–1.025)
Urobilinogen, UA: 0.2 U/dL
pH, UA: 6.5 (ref 5.0–8.0)

## 2023-05-01 MED ORDER — ATORVASTATIN CALCIUM 10 MG PO TABS: 10.0000 mg | ORAL_TABLET | Freq: Every day | ORAL | 0 refills | Status: DC

## 2023-05-01 MED ORDER — LOSARTAN POTASSIUM 50 MG PO TABS: 50.0000 mg | ORAL_TABLET | Freq: Every day | ORAL | 1 refills | Status: DC

## 2023-05-01 NOTE — Patient Instructions (Signed)
Great work with quitting smoking! Cut back on drinks on the weekends to see if that may also help blood pressure.  Stop hydrochlorothiazide for now as that might be the medicine that is causing you to urinate frequently during the day and night.  Will increase the losartan to 50 mg a day but monitor your blood pressures and if those start to creep up let me know and we can adjust medicine further.  If any side effects of the higher dose of losartan let me know but I will recheck with you in 6 weeks.  We did repeat the cholesterol level today and can adjust the atorvastatin cholesterol medicine based on those results if needed.  I expect the urinary symptoms to improve, let me know if those do not get better off the hydrochlorothiazide.  Take care!
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
## 2023-05-02 DIAGNOSIS — M47816 Spondylosis without myelopathy or radiculopathy, lumbar region: Secondary | ICD-10-CM | POA: Diagnosis not present

## 2023-05-02 LAB — URINE CULTURE
MICRO NUMBER:: 15797150
Result:: NO GROWTH
SPECIMEN QUALITY:: ADEQUATE

## 2023-05-02 LAB — LIPID PANEL
Cholesterol: 171 mg/dL (ref 0–200)
HDL: 38.2 mg/dL — ABNORMAL LOW (ref >39.00)
LDL Cholesterol: 79 mg/dL (ref 0–99)
NonHDL: 132.48
Total CHOL/HDL Ratio: 4
Triglycerides: 269 mg/dL — ABNORMAL HIGH (ref 0.0–149.0)
VLDL: 53.8 mg/dL — ABNORMAL HIGH (ref 0.0–40.0)

## 2023-05-02 LAB — PSA: PSA: 2.97 ng/mL (ref 0.10–4.00)

## 2023-05-02 LAB — HEPATIC FUNCTION PANEL
ALT: 18 U/L (ref 0–53)
AST: 19 U/L (ref 0–37)
Albumin: 4.2 g/dL (ref 3.5–5.2)
Alkaline Phosphatase: 72 U/L (ref 39–117)
Bilirubin, Direct: 0 mg/dL (ref 0.0–0.3)
Total Bilirubin: 0.5 mg/dL (ref 0.2–1.2)
Total Protein: 7.1 g/dL (ref 6.0–8.3)

## 2023-05-04 DIAGNOSIS — Z79891 Long term (current) use of opiate analgesic: Secondary | ICD-10-CM | POA: Diagnosis not present

## 2023-05-04 DIAGNOSIS — M5416 Radiculopathy, lumbar region: Secondary | ICD-10-CM | POA: Diagnosis not present

## 2023-05-04 DIAGNOSIS — Z5181 Encounter for therapeutic drug level monitoring: Secondary | ICD-10-CM | POA: Diagnosis not present

## 2023-05-04 DIAGNOSIS — G894 Chronic pain syndrome: Secondary | ICD-10-CM | POA: Diagnosis not present

## 2023-05-12 NOTE — Progress Notes (Signed)
Pt has been notified Pt denies referral at this time

## 2023-05-16 NOTE — Addendum Note (Signed)
Addended by: Eldred Manges on: 05/16/2023 09:37 AM   Modules accepted: Orders

## 2023-05-22 DIAGNOSIS — Z860101 Personal history of adenomatous and serrated colon polyps: Secondary | ICD-10-CM | POA: Diagnosis not present

## 2023-05-22 DIAGNOSIS — Z09 Encounter for follow-up examination after completed treatment for conditions other than malignant neoplasm: Secondary | ICD-10-CM | POA: Diagnosis not present

## 2023-05-22 DIAGNOSIS — K635 Polyp of colon: Secondary | ICD-10-CM | POA: Diagnosis not present

## 2023-06-05 ENCOUNTER — Ambulatory Visit (INDEPENDENT_AMBULATORY_CARE_PROVIDER_SITE_OTHER): Payer: BC Managed Care – PPO | Admitting: Urology

## 2023-06-05 ENCOUNTER — Encounter: Payer: Self-pay | Admitting: Urology

## 2023-06-05 VITALS — BP 181/93 | HR 72 | Ht 74.0 in | Wt 220.0 lb

## 2023-06-05 DIAGNOSIS — Z8042 Family history of malignant neoplasm of prostate: Secondary | ICD-10-CM

## 2023-06-05 DIAGNOSIS — R351 Nocturia: Secondary | ICD-10-CM

## 2023-06-05 DIAGNOSIS — R350 Frequency of micturition: Secondary | ICD-10-CM | POA: Diagnosis not present

## 2023-06-05 LAB — URINALYSIS, ROUTINE W REFLEX MICROSCOPIC
Bilirubin, UA: NEGATIVE
Glucose, UA: NEGATIVE
Ketones, UA: NEGATIVE
Leukocytes,UA: NEGATIVE
Nitrite, UA: NEGATIVE
Protein,UA: NEGATIVE
RBC, UA: NEGATIVE
Specific Gravity, UA: 1.015 (ref 1.005–1.030)
Urobilinogen, Ur: 0.2 mg/dL (ref 0.2–1.0)
pH, UA: 6 (ref 5.0–7.5)

## 2023-06-05 LAB — BLADDER SCAN AMB NON-IMAGING

## 2023-06-05 MED ORDER — GEMTESA 75 MG PO TABS: 75.0000 mg | ORAL_TABLET | Freq: Every day | ORAL | Status: AC
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
## 2023-06-16 ENCOUNTER — Other Ambulatory Visit: Payer: Self-pay | Admitting: Family Medicine

## 2023-06-16 DIAGNOSIS — F17210 Nicotine dependence, cigarettes, uncomplicated: Secondary | ICD-10-CM

## 2023-06-19 ENCOUNTER — Ambulatory Visit: Payer: BC Managed Care – PPO | Admitting: Urology

## 2023-06-28 DIAGNOSIS — M51362 Other intervertebral disc degeneration, lumbar region with discogenic back pain and lower extremity pain: Secondary | ICD-10-CM | POA: Diagnosis not present

## 2023-06-28 DIAGNOSIS — Z6828 Body mass index (BMI) 28.0-28.9, adult: Secondary | ICD-10-CM | POA: Diagnosis not present

## 2023-06-29 DIAGNOSIS — G894 Chronic pain syndrome: Secondary | ICD-10-CM | POA: Diagnosis not present

## 2023-06-29 DIAGNOSIS — Z79891 Long term (current) use of opiate analgesic: Secondary | ICD-10-CM | POA: Diagnosis not present

## 2023-06-29 DIAGNOSIS — Z5181 Encounter for therapeutic drug level monitoring: Secondary | ICD-10-CM | POA: Diagnosis not present

## 2023-06-29 DIAGNOSIS — M5416 Radiculopathy, lumbar region: Secondary | ICD-10-CM | POA: Diagnosis not present

## 2023-07-06 ENCOUNTER — Ambulatory Visit: Payer: BC Managed Care – PPO | Admitting: Urology

## 2023-07-07 ENCOUNTER — Other Ambulatory Visit: Payer: Self-pay | Admitting: Family Medicine

## 2023-07-07 DIAGNOSIS — E785 Hyperlipidemia, unspecified: Secondary | ICD-10-CM

## 2023-08-01 ENCOUNTER — Other Ambulatory Visit: Payer: Self-pay | Admitting: Family Medicine

## 2023-08-01 DIAGNOSIS — I10 Essential (primary) hypertension: Secondary | ICD-10-CM

## 2023-08-23 ENCOUNTER — Telehealth: Payer: Self-pay | Admitting: Family Medicine

## 2023-08-23 ENCOUNTER — Encounter: Payer: Self-pay | Admitting: Family Medicine

## 2023-08-23 ENCOUNTER — Ambulatory Visit (INDEPENDENT_AMBULATORY_CARE_PROVIDER_SITE_OTHER): Admitting: Family Medicine

## 2023-08-23 VITALS — BP 142/88 | HR 68 | Temp 98.8°F | Ht 74.0 in | Wt 226.0 lb

## 2023-08-23 DIAGNOSIS — G894 Chronic pain syndrome: Secondary | ICD-10-CM | POA: Diagnosis not present

## 2023-08-23 DIAGNOSIS — I10 Essential (primary) hypertension: Secondary | ICD-10-CM

## 2023-08-23 MED ORDER — LOSARTAN POTASSIUM 100 MG PO TABS: 100.0000 mg | ORAL_TABLET | Freq: Every day | ORAL | 1 refills | Status: DC

## 2023-08-23 NOTE — Telephone Encounter (Signed)
 Called patient to discuss l, no answer, left a message for the patient to call back to discuss or respond by MyChart if that is prefered.

## 2023-08-23 NOTE — Telephone Encounter (Signed)
 Copied from CRM [id number]. Topic: General - Other >> Aug 23, 2023 11:25 AM Pascal Lux wrote:   Reason for CRM: Patient called requesting a call back from Dr. Paralee Cancel nurse [phone].

## 2023-08-23 NOTE — Patient Instructions (Addendum)
 For blood pressure try increasing losartan to 100 mg/day.  Stop any supplements including creatine for now, and bring those with you to the next visit to make sure there are not other chemicals that could be affecting blood pressure.  Continue to try to cut back on alcohol including on weekends as that can affect blood pressure.  Recheck in 1 week but I will complete a letter for your employer in the meantime.  A specific restrictions regarding your back and chronic pain need to be discernment/discussed with your pain provider, back specialist.
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
## 2023-08-24 ENCOUNTER — Ambulatory Visit (INDEPENDENT_AMBULATORY_CARE_PROVIDER_SITE_OTHER): Admitting: Family Medicine

## 2023-08-24 ENCOUNTER — Encounter: Payer: Self-pay | Admitting: Family Medicine

## 2023-08-24 DIAGNOSIS — I10 Essential (primary) hypertension: Secondary | ICD-10-CM

## 2023-08-24 NOTE — Progress Notes (Signed)
 Here for paperwork reviewed.  Office visit yesterday, see details on that visit.  He does have a form for medical and physical capabilities assessment.  I did provide a letter yesterday indicating that some of those restrictions may need to be decided by his pain specialist, and neurosurgeon.  He has full range of motion of his lumbar and thoracic spine, full range of motion of upper and lower extremities, intact and equal grip strength, upper and lower extremity strength, able to squat and stand without difficulty.  Form reviewed and completed based on current exam and history.  Specific restrictions regarding lifting should be determined by his back specialist.  Form completed today and given to patient.  Copy for chart.  Keep follow-up next week as planned for blood pressure reassessment.

## 2023-10-08 ENCOUNTER — Emergency Department (HOSPITAL_COMMUNITY)
Admission: EM | Admit: 2023-10-08 | Discharge: 2023-10-08 | Disposition: A | Discharge status: Home / Self Care | Payer: Self-pay | Attending: Emergency Medicine | Admitting: Emergency Medicine

## 2023-10-08 ENCOUNTER — Encounter (HOSPITAL_COMMUNITY): Payer: Self-pay

## 2023-10-08 ENCOUNTER — Other Ambulatory Visit: Payer: Self-pay

## 2023-10-08 ENCOUNTER — Emergency Department (HOSPITAL_COMMUNITY): Payer: Self-pay

## 2023-10-08 DIAGNOSIS — M7731 Calcaneal spur, right foot: Secondary | ICD-10-CM | POA: Diagnosis not present

## 2023-10-08 DIAGNOSIS — M2011 Hallux valgus (acquired), right foot: Secondary | ICD-10-CM | POA: Diagnosis not present

## 2023-10-08 DIAGNOSIS — M79671 Pain in right foot: Secondary | ICD-10-CM | POA: Insufficient documentation

## 2023-10-08 DIAGNOSIS — I10 Essential (primary) hypertension: Secondary | ICD-10-CM | POA: Insufficient documentation

## 2023-10-08 DIAGNOSIS — Z79899 Other long term (current) drug therapy: Secondary | ICD-10-CM | POA: Insufficient documentation

## 2023-10-08 MED ORDER — KETOROLAC TROMETHAMINE 15 MG/ML IJ SOLN
15.0000 mg | Freq: Once | INTRAMUSCULAR | Status: AC
  Administered 2023-10-08: 15 mg via INTRAMUSCULAR
  Filled 2023-10-08: qty 1

## 2023-10-08 NOTE — Discharge Instructions (Addendum)
 You were evaluated in the emergency room for right foot pain.  You can review your results on MyChart later today.  You are provided a referral for orthopedics.  You may use Tylenol  and ibuprofen  as needed for pain.

## 2023-10-08 NOTE — Progress Notes (Signed)
 Orthopedic Tech Progress Note Patient Details:  SULTAN KETTNER Mar 01, 1971 [id number]  Ortho Devices Type of Ortho Device: Postop shoe/boot, Crutches Ortho Device/Splint Location: RLE Ortho Device/Splint Interventions: Ordered, Application, Adjustment   Post Interventions Patient Tolerated: Well Instructions Provided: Adjustment of device, Care of device  Herbie Loll 10/08/2023, 3:33 PM

## 2023-10-08 NOTE — ED Provider Notes (Signed)
 [place] EMERGENCY DEPARTMENT AT [facility] Provider Note   CSN: [id number] Arrival date & time: 10/08/23  1215     History  Chief Complaint  Patient presents with   Leg Pain    Scott Galloway is a 53 y.o. male with history of EtOH abuse, hypertension presents with complaints of right lower extremity pain.  Denies any injury or trauma.  States that yesterday was at a barbecue and then woke up with this pain and is having difficulty weightbearing.  No prior injuries or surgeries to the affected extremity.   Leg Pain     Past Medical History:  Diagnosis Date   Alcohol abuse    Hypertension      Home Medications Prior to Admission medications   Medication Sig Start Date End Date Taking? Authorizing Provider  losartan  (COZAAR ) 100 MG tablet Take 1 tablet (100 mg total) by mouth daily. 08/23/23   Benjiman Bras, MD  varenicline  (CHANTIX ) 1 MG tablet TAKE 1 TABLET BY MOUTH TWICE A DAY 06/16/23   Benjiman Bras, MD  Vibegron  (GEMTESA ) 75 MG TABS Take 1 tablet (75 mg total) by mouth daily. 06/05/23   Stoneking, Ponce Brisker., MD      Allergies    Amlodipine     Review of Systems   Review of Systems  Musculoskeletal:  Positive for arthralgias.    Physical Exam Updated Vital Signs BP (!) 146/94 (BP Location: Left Arm)   Pulse 98   Temp 98.3 F (36.8 C) (Oral)   Resp 16   Ht 6\' 2"  (1.88 m)   Wt 102.5 kg   SpO2 99%   BMI 29.01 kg/m  Physical Exam Vitals and nursing note reviewed.  Constitutional:      General: He is not in acute distress.    Appearance: He is well-developed.  HENT:     Head: Normocephalic and atraumatic.  Eyes:     Conjunctiva/sclera: Conjunctivae normal.  Cardiovascular:     Pulses: Normal pulses.  Pulmonary:     Effort: Pulmonary effort is normal. No respiratory distress.  Musculoskeletal:        General: No swelling.     Cervical back: Neck supple.     Comments: Right lower extremity with dorsal tenderness to the midfoot,  there is no overlying ecchymosis, erythema or warmth, no significant swelling.  Tolerates full range of motion of ankle and digits.  Tolerates full range of motion of the knee, negative Homans, DP, PT pulses are 2+, compartments are soft  Skin:    General: Skin is warm and dry.     Capillary Refill: Capillary refill takes less than 2 seconds.  Neurological:     Mental Status: He is alert.  Psychiatric:        Mood and Affect: Mood normal.     ED Results / Procedures / Treatments   Labs (all labs ordered are listed, but only abnormal results are displayed) Labs Reviewed - No data to display  EKG None  Radiology No results found.  Procedures Procedures    Medications Ordered in ED Medications  ketorolac  (TORADOL ) 15 MG/ML injection 15 mg (has no administration in time range)    ED Course/ Medical Decision Making/ A&P                                 Medical Decision Making  This patient presents to the ED with chief complaint(s) of  foot pain.  The complaint involves an extensive differential diagnosis and also carries with it a high risk of complications and morbidity.   Pertinent past medical history as listed in HPI  The differential diagnosis includes  Septic joint, gout, fracture, dislocation, sprain, Morton's neuroma Additional history obtained: Records reviewed Care Everywhere/External Records  Assessment and management:   Hemodynamically stable, nontoxic-appearing patient presenting with complaints of atraumatic foot pain that started this morning.  Yesterday he states he was at a barbecue and then woke up with this pain.  Denies any numbness or tingling.  Pain is sharp and worse with weightbearing.  On exam there is no swelling, ecchymosis, overlying erythema or warmth.  He does have tenderness to the dorsum of the midfoot.  Tolerates full range of motion of ankle and digits.  Negative Homans, compartments are soft.  Will obtain plain films and give Toradol  for pain.   No suspicion for septic joint, gout, DVT.  Most likely etiology musculoskeletal.  X-rays with possible occult nondisplaced fracture of third metatarsal seen on one view. Patient is not interested in waiting on formal read. Will provide postop shoe and crutches and have him follow-up with orthopedics.   Independent ECG interpretation:  none  Independent labs interpretation:  The following labs were independently interpreted:  none  Consultations obtained:   none  Disposition:   Patient will be discharged home. The patient has been appropriately medically screened and/or stabilized in the ED. I have low suspicion for any other emergent medical condition which would require further screening, evaluation or treatment in the ED or require inpatient management. At time of discharge the patient is hemodynamically stable and in no acute distress. I have discussed work-up results and diagnosis with patient and answered all questions. Patient is agreeable with discharge plan. We discussed strict return precautions for returning to the emergency department and they verbalized understanding.     Social Determinants of Health:   none  This note was dictated with voice recognition software.  Despite best efforts at proofreading, errors may have occurred which can change the documentation meaning.          Final Clinical Impression(s) / ED Diagnoses Final diagnoses:  Right foot pain    Rx / DC Orders ED Discharge Orders     None         Felicie Horning, PA-C 10/08/23 1529    Burnette Carte, MD 10/09/23 [phone]

## 2023-10-08 NOTE — ED Triage Notes (Signed)
 Pt reports with sharp right lower leg pain that started this morning. Pt denies injuries and reports he was bbqing yesterday.

## 2023-11-17 ENCOUNTER — Other Ambulatory Visit: Payer: Self-pay | Admitting: Family Medicine

## 2023-11-17 NOTE — Telephone Encounter (Signed)
 Copied from CRM [id number]. Topic: Clinical - Medication Refill >> Nov 17, 2023  3:14 PM [place] J wrote: Medication: losartan  (COZAAR ) 100 MG tablet  Has the patient contacted their pharmacy? No (Agent: If no, request that the patient contact the pharmacy for the refill. If patient does not wish to contact the pharmacy document the reason why and proceed with request.) (Agent: If yes, when and what did the pharmacy advise?)  This is the patient's preferred pharmacy:  [id number] [place],  - [address]  [address] Phone: [phone] Fax: [phone]  Is this the correct pharmacy for this prescription? Yes If no, delete pharmacy and type the correct one.   Has the prescription been filled recently? No  Is the patient out of the medication? Yes  Has the patient been seen for an appointment in the last year OR does the patient have an upcoming appointment? Yes  Can we respond through MyChart? Yes  Agent: Please be advised that Rx refills may take up to 3 business days. We ask that you follow-up with your pharmacy.

## 2023-11-17 NOTE — Telephone Encounter (Signed)
 Called pharmacy and verified they have this and they will fill it. I called patient to make him aware

## 2024-03-24 ENCOUNTER — Other Ambulatory Visit: Payer: Self-pay | Admitting: Family Medicine

## 2024-03-28 ENCOUNTER — Ambulatory Visit: Payer: Self-pay | Admitting: Family Medicine

## 2024-03-28 MED ORDER — OMEPRAZOLE 20 MG PO CPDR: 20.0000 mg | DELAYED_RELEASE_CAPSULE | Freq: Every day | ORAL | 3 refills | Status: DC

## 2024-03-28 NOTE — Telephone Encounter (Signed)
 Pt states that he has been out of his reflux medication for a couple of months. He states that it has just been getting worse hasn't ever stopped. 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 He'd like the medication to be refilled he was on previously. (Omeprazole )   Okay to refill?  I called patient but he was on the phone with his boss and would have to call me back.

## 2024-03-28 NOTE — Telephone Encounter (Signed)
 Restart omeprazole , sent that in.  If symptoms do not improve quickly, should be seen in office orr urgent care/ER if needed for any persistent or worsening abdominal symptoms.  Last visit with me in March, please schedule office visit in the next few weeks.  Recheck chronic conditions.  Thanks

## 2024-03-28 NOTE — Addendum Note (Signed)
 Addended by: Alys Dulak R on: 03/28/2024 05:54 PM   Modules accepted: Orders

## 2024-03-28 NOTE — Telephone Encounter (Signed)
 FYI Only or Action Required?: Action required by provider: medication refill request.  Patient was last seen in primary care on 08/24/2023 by Scott Reyes SAUNDERS, MD.  Called Nurse Triage reporting Heartburn.  Symptoms began several months ago.  Interventions attempted: Nothing.  Symptoms are: gradually worsening.  Triage Disposition: See PCP Within 2 Weeks  Patient/caregiver understands and will follow disposition?: No, wishes to speak with PCP     Copied from CRM #[id number]. Topic: Clinical - Prescription Issue >> Mar 28, 2024 10:53 AM Alfonso ORN wrote: Reason for CRM: pt experiencing acid reflux symptoms and on the road away from home. He is requesting previous acid reflux medication. Please contact pt to advise Reason for Disposition  [1] Abdominal pain is intermittent AND [2] shoots into chest, with sour taste in mouth  (Exception: Symptoms same as previously diagnosed reflux and not tried antacids.)  Answer Assessment - Initial Assessment Questions Pt states that he has been out of his reflux medication for a couple of months. He states that it has just been getting worse hasn't ever stopped. 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 He'd like the medication to be refilled he was on previously. (Omeprazole )   1. LOCATION: Where does it hurt?      Upper abdomen 2. RADIATION: Does the pain shoot anywhere else? (e.g., chest, back)     no 3. ONSET: When did the pain begin? (e.g., minutes, hours or days ago)      On going 4. SUDDEN: Gradual or sudden onset?     ongoing 5. PATTERN Does the pain come and go, or is it constant?     intermittent  7. RECURRENT SYMPTOM: Have you ever had this type of stomach pain before? If Yes, ask: When was the last time? and What happened that time?      yes 8. AGGRAVATING FACTORS: Does anything seem to cause this pain? (e.g., foods, stress, alcohol)      alcohol 9. CARDIAC SYMPTOMS: Do you have any of the following symptoms: chest pain, difficulty breathing, sweating, nausea?     Upper chest, heart burn 10. OTHER SYMPTOMS: Do you have any other symptoms? (e.g., back pain, diarrhea, fever, urination pain, vomiting)       no  Protocols used: Abdominal Pain - Upper-A-AH

## 2024-03-29 NOTE — Telephone Encounter (Signed)
 Called patient and left vm to return call to relay Dr. Valorie message.

## 2024-03-29 NOTE — Telephone Encounter (Unsigned)
 Copied from CRM #[id number]. Topic: General - Other >> Mar 29, 2024  8:34 AM Robinson H wrote: Reason for CRM: Patient returning call to office, agent relayed message as stated from provider, patient will call back to schedule appointment when he gets his schedule

## 2024-06-06 ENCOUNTER — Ambulatory Visit: Payer: BC Managed Care – PPO | Admitting: Urology

## 2024-06-22 ENCOUNTER — Other Ambulatory Visit: Payer: Self-pay | Admitting: Family Medicine
